# Patient Record
Sex: Female | Born: 1945
Health system: Southern US, Community
[De-identification: ages and names within clinical notes are randomized; demographics above are authoritative.]

## PROBLEM LIST (undated history)

## (undated) DIAGNOSIS — I10 Essential (primary) hypertension: Secondary | ICD-10-CM

## (undated) DIAGNOSIS — E7800 Pure hypercholesterolemia, unspecified: Secondary | ICD-10-CM

## (undated) HISTORY — DX: Pure hypercholesterolemia, unspecified: E78.00

---

## 2006-01-06 ENCOUNTER — Encounter: Admission: RE | Admit: 2006-01-06 | Discharge: 2006-01-06 | Payer: Self-pay | Admitting: Cardiology

## 2006-01-13 ENCOUNTER — Other Ambulatory Visit: Admission: RE | Admit: 2006-01-13 | Discharge: 2006-01-13 | Payer: Self-pay | Admitting: Gynecology

## 2006-02-01 ENCOUNTER — Encounter: Admission: RE | Admit: 2006-02-01 | Discharge: 2006-02-01 | Payer: Self-pay | Admitting: Gynecology

## 2007-01-19 ENCOUNTER — Other Ambulatory Visit: Admission: RE | Admit: 2007-01-19 | Discharge: 2007-01-19 | Payer: Self-pay | Admitting: Gynecology

## 2007-02-04 ENCOUNTER — Encounter: Admission: RE | Admit: 2007-02-04 | Discharge: 2007-02-04 | Payer: Self-pay | Admitting: Gynecology

## 2008-02-06 ENCOUNTER — Encounter: Admission: RE | Admit: 2008-02-06 | Discharge: 2008-02-06 | Payer: Self-pay | Admitting: Gynecology

## 2009-02-11 ENCOUNTER — Encounter: Admission: RE | Admit: 2009-02-11 | Discharge: 2009-02-11 | Payer: Self-pay | Admitting: Gynecology

## 2010-02-25 ENCOUNTER — Encounter: Admission: RE | Admit: 2010-02-25 | Discharge: 2010-02-25 | Payer: Self-pay | Admitting: Gynecology

## 2011-01-20 ENCOUNTER — Other Ambulatory Visit: Payer: Self-pay | Admitting: Gynecology

## 2011-01-20 DIAGNOSIS — Z1231 Encounter for screening mammogram for malignant neoplasm of breast: Secondary | ICD-10-CM

## 2011-02-27 ENCOUNTER — Ambulatory Visit
Admission: RE | Admit: 2011-02-27 | Discharge: 2011-02-27 | Disposition: A | Discharge status: Home / Self Care | Payer: Medicare Other | Source: Ambulatory Visit | Attending: Gynecology | Admitting: Gynecology

## 2011-02-27 DIAGNOSIS — Z1231 Encounter for screening mammogram for malignant neoplasm of breast: Secondary | ICD-10-CM

## 2011-03-06 ENCOUNTER — Other Ambulatory Visit: Payer: Self-pay | Admitting: Gynecology

## 2011-03-06 DIAGNOSIS — R928 Other abnormal and inconclusive findings on diagnostic imaging of breast: Secondary | ICD-10-CM

## 2011-03-17 ENCOUNTER — Ambulatory Visit
Admission: RE | Admit: 2011-03-17 | Discharge: 2011-03-17 | Disposition: A | Discharge status: Home / Self Care | Payer: Medicare Other | Source: Ambulatory Visit | Attending: Gynecology | Admitting: Gynecology

## 2011-03-17 DIAGNOSIS — R928 Other abnormal and inconclusive findings on diagnostic imaging of breast: Secondary | ICD-10-CM

## 2012-02-12 ENCOUNTER — Other Ambulatory Visit: Payer: Self-pay | Admitting: Gynecology

## 2012-02-12 DIAGNOSIS — Z1231 Encounter for screening mammogram for malignant neoplasm of breast: Secondary | ICD-10-CM

## 2012-03-15 ENCOUNTER — Ambulatory Visit
Admission: RE | Admit: 2012-03-15 | Discharge: 2012-03-15 | Disposition: A | Discharge status: Home / Self Care | Payer: Medicare Other | Source: Ambulatory Visit | Attending: Gynecology | Admitting: Gynecology

## 2012-03-15 DIAGNOSIS — Z1231 Encounter for screening mammogram for malignant neoplasm of breast: Secondary | ICD-10-CM

## 2012-09-21 ENCOUNTER — Emergency Department (INDEPENDENT_AMBULATORY_CARE_PROVIDER_SITE_OTHER): Payer: Medicare PPO

## 2012-09-21 ENCOUNTER — Emergency Department (HOSPITAL_COMMUNITY)
Admission: EM | Admit: 2012-09-21 | Discharge: 2012-09-21 | Disposition: A | Discharge status: Home / Self Care | Payer: Medicare PPO | Source: Home / Self Care

## 2012-09-21 ENCOUNTER — Encounter (HOSPITAL_COMMUNITY): Payer: Self-pay | Admitting: Emergency Medicine

## 2012-09-21 DIAGNOSIS — S63599A Other specified sprain of unspecified wrist, initial encounter: Secondary | ICD-10-CM

## 2012-09-21 DIAGNOSIS — S66819A Strain of other specified muscles, fascia and tendons at wrist and hand level, unspecified hand, initial encounter: Secondary | ICD-10-CM

## 2012-09-21 HISTORY — DX: Essential (primary) hypertension: I10

## 2012-09-21 NOTE — ED Provider Notes (Signed)
ANDHistory     CSN: [id number]  Arrival date & time 09/21/12  1428   None     Chief Complaint  Patient presents with  . Hand Injury    (Consider location/radiation/quality/duration/timing/severity/associated sxs/prior treatment) HPI Comments: 67 year old female fell ascending the stairs approximately 3 days ago. She fell onto her right outstretched hand injuring her right wrist. She is complaining of painful mobility and swelling. She states it has improved somewhat however her symptoms are persistent. Denies injury to the hand elbow or shoulder. Denies injury to the head neck chest back or other extremities.   Past Medical History  Diagnosis Date  . Hypertension     History reviewed. No pertinent past surgical history.  No family history on file.  History  Substance Use Topics  . Smoking status: Never Smoker   . Smokeless tobacco: Not on file  . Alcohol Use: No    OB History   Grav Para Term Preterm Abortions TAB SAB Ect Mult Living                  Review of Systems  Constitutional: Negative for fever, chills and activity change.  HENT: Negative.   Respiratory: Negative.   Cardiovascular: Negative.   Musculoskeletal:       As per HPI  Skin: Negative for color change, pallor and rash.  Neurological: Negative.     Allergies  Review of patient's allergies indicates no known allergies.  Home Medications   Current Outpatient Rx  Name  Route  Sig  Dispense  Refill  . amLODipine-valsartan (EXFORGE) 5-320 MG per tablet   Oral   Take 1 tablet by mouth daily.         Marland Kitchen aspirin 81 MG tablet   Oral   Take 81 mg by mouth daily.         . rosuvastatin (CRESTOR) 10 MG tablet   Oral   Take 10 mg by mouth daily.           BP 139/67  Pulse 70  Temp(Src) 98.1 F (36.7 C) (Oral)  Resp 24  SpO2 99%  Physical Exam  Constitutional: She is oriented to person, place, and time. She appears well-developed and well-nourished. No distress.  HENT:  Head:  Normocephalic and atraumatic.  Eyes: EOM are normal. Pupils are equal, round, and reactive to light.  Neck: Normal range of motion. Neck supple.  Musculoskeletal: Normal range of motion. She exhibits edema.  Minor tenderness to the right wrist. Minor swelling. No specific bony tenderness. Strength is 3/5. Distal neurovascular is intact. No deformity or discoloration.  Lymphadenopathy:    She has no cervical adenopathy.  Neurological: She is alert and oriented to person, place, and time. No cranial nerve deficit.  Skin: Skin is warm and dry.  Psychiatric: She has a normal mood and affect.    ED Course  Procedures (including critical care time)  Labs Reviewed - No data to display Dg Wrist Complete Right  09/21/2012  *RADIOLOGY REPORT*  Clinical Data: Fall, persistent pain  RIGHT WRIST - COMPLETE 3+ VIEW  Comparison: None.  Findings: Normal alignment.  On the lateral view, there is minimal dorsal cortical irregularity of the distal radius.  This could represent a subtle nondisplaced fracture.  Distal ulna and carpal bones appear intact.  Recommend correlation for point tenderness in this region.  IMPRESSION: Right distal radius minimal dorsal cortical irregularity, nondisplaced fracture not excluded.   Original Report Authenticated By: Judie Petit. Miles Costain, M.D.  1. Wrist sprain and strain, right, initial encounter       MDM  tHERE Is a question of will irregularity the dorsal aspect of the distal radius. Clinical correlation refills minor tenderness to the dorsum of the wrist but no specific point tenderness in the area of the cortical irregularity. Have advised the patient to apply ice for the next couple days off and on, wear the wrist splint at all times for at least one week. If there is no improvement in 3-4 days or any new symptoms or problems or worsening call the orthopedist on her instruction sheet for an appointment. Limit use of the left hand and wrist.         Hayden Rasmussen,  NP 09/21/12 1713

## 2012-09-21 NOTE — ED Notes (Signed)
Pt c/o right hand inj onset Saturday Reports falling down the steps going into the house (onto concrete) Sx include: swelling of hand/arm and pain that increases w/activity Denies: Head inj/LOC She is able to move her hand w/mild discomfort Has placed icepack over it  She is alert and oriented w/no signs of acute distress.

## 2012-09-21 NOTE — ED Provider Notes (Signed)
Medical screening examination/treatment/procedure(s) were performed by non-physician practitioner and as supervising physician I was immediately available for consultation/collaboration.  Leslee Home, M.D.  Reuben Likes, MD 09/21/12 [phone]

## 2013-02-15 ENCOUNTER — Other Ambulatory Visit: Payer: Self-pay

## 2013-09-26 ENCOUNTER — Encounter: Payer: Self-pay | Admitting: Gynecology

## 2013-09-26 ENCOUNTER — Ambulatory Visit (INDEPENDENT_AMBULATORY_CARE_PROVIDER_SITE_OTHER): Payer: Medicare PPO | Admitting: Gynecology

## 2013-09-26 VITALS — BP 138/88 | Ht 72.0 in | Wt 149.0 lb

## 2013-09-26 DIAGNOSIS — IMO0002 Reserved for concepts with insufficient information to code with codable children: Secondary | ICD-10-CM

## 2013-09-26 DIAGNOSIS — I10 Essential (primary) hypertension: Secondary | ICD-10-CM | POA: Insufficient documentation

## 2013-09-26 DIAGNOSIS — N952 Postmenopausal atrophic vaginitis: Secondary | ICD-10-CM

## 2013-09-26 DIAGNOSIS — M8580 Other specified disorders of bone density and structure, unspecified site: Secondary | ICD-10-CM | POA: Insufficient documentation

## 2013-09-26 DIAGNOSIS — E785 Hyperlipidemia, unspecified: Secondary | ICD-10-CM | POA: Insufficient documentation

## 2013-09-26 DIAGNOSIS — N951 Menopausal and female climacteric states: Secondary | ICD-10-CM

## 2013-09-26 DIAGNOSIS — M949 Disorder of cartilage, unspecified: Secondary | ICD-10-CM

## 2013-09-26 DIAGNOSIS — M899 Disorder of bone, unspecified: Secondary | ICD-10-CM

## 2013-09-26 DIAGNOSIS — Z780 Asymptomatic menopausal state: Secondary | ICD-10-CM

## 2013-09-26 MED ORDER — ESTRADIOL 0.1 MG/GM VA CREA: 1.0000 | TOPICAL_CREAM | VAGINAL | Status: DC

## 2013-09-26 MED ORDER — METRONIDAZOLE 0.75 % VA GEL: VAGINAL | Status: DC

## 2013-09-26 NOTE — Patient Instructions (Addendum)
Bone Densitometry Bone densitometry is a special X-ray that measures your bone density and can be used to help predict your risk of bone fractures. This test is used to determine bone mineral content and density to diagnose osteoporosis. Osteoporosis is the loss of bone that may cause the bone to become weak. Osteoporosis commonly occurs in women entering menopause. However, it may be found in men and in people with other diseases. PREPARATION FOR TEST No preparation necessary. WHO SHOULD BE TESTED?  All women older than 65.  Postmenopausal women (50 to 65) with risk factors for osteoporosis.  People with a previous fracture caused by normal activities.  People with a small body frame (less than 127 poundsor a body mass index [BMI] of less than 21).  People who have a parent with a hip fracture or history of osteoporosis.  People who smoke.  People who have rheumatoid arthritis.  Anyone who engages in excessive alcohol use (more than 3 drinks most days).  Women who experience early menopause. WHEN SHOULD YOU BE RETESTED? Current guidelines suggest that you should wait at least 2 years before doing a bone density test again if your first test was normal.Recent studies indicated that women with normal bone density may be able to wait a few years before needing to repeat a bone density test. You should discuss this with your caregiver.  NORMAL FINDINGS   Normal: less than standard deviation below normal (greater than -1).  Osteopenia: 1 to 2.5 standard deviations below normal (-1 to -2.5).  Osteoporosis: greater than 2.5 standard deviations below normal (less than -2.5). Test results are reported as a "T score" and a "Z score."The T score is a number that compares your bone density with the bone density of healthy, young women.The Z score is a number that compares your bone density with the scores of women who are the same age, gender, and race.  Ranges for normal findings may vary  among different laboratories and hospitals. You should always check with your doctor after having lab work or other tests done to discuss the meaning of your test results and whether your values are considered within normal limits. MEANING OF TEST  Your caregiver will go over the test results with you and discuss the importance and meaning of your results, as well as treatment options and the need for additional tests if necessary. OBTAINING THE TEST RESULTS It is your responsibility to obtain your test results. Ask the lab or department performing the test when and how you will get your results. Document Released: 06/23/2004 Document Revised: 08/24/2011 Document Reviewed: 07/16/2010 Exit[employer] Patient Information 2014 [employer]. Shingles Vaccine What You Need to Know WHAT IS SHINGLES?  Shingles is a painful skin rash, often with blisters. It is also called Herpes Zoster or just Zoster.  A shingles rash usually appears on one side of the face or body and lasts from 2 to 4 weeks. Its main symptom is pain, which can be quite severe. Other symptoms of shingles can include fever, headache, chills, and upset stomach. Very rarely, a shingles infection can lead to pneumonia, hearing problems, blindness, brain inflammation (encephalitis), or death.  For about 1 person in 5, severe pain can continue even after the rash clears up. This is called post-herpetic neuralgia.  Shingles is caused by the Varicella Zoster virus. This is the same virus that causes chickenpox. Only someone who has had a case of chickenpox or rarely, has gotten chickenpox vaccine, can get shingles. The virus stays   in your body. It can reappear many years later to cause a case of shingles.  You cannot catch shingles from another person with shingles. However, a person who has never had chickenpox (or chickenpox vaccine) could get chickenpox from someone with shingles. This is not very common.  Shingles is far more common in people  50 and older than in younger people. It is also more common in people whose immune systems are weakened because of a disease such as cancer or drugs such as steroids or chemotherapy.  At least 1 million people get shingles per year in the [place]. SHINGLES VACCINE  A vaccine for shingles was licensed in 2006. In clinical trials, the vaccine reduced the risk of shingles by 50%. It can also reduce the pain in people who still get shingles after being vaccinated.  A single dose of shingles vaccine is recommended for adults 60 years of age and older. SOME PEOPLE SHOULD NOT GET SHINGLES VACCINE OR SHOULD WAIT A person should not get shingles vaccine if he or she:  Has ever had a life-threatening allergic reaction to gelatin, the antibiotic neomycin, or any other component of shingles vaccine. Tell your caregiver if you have any severe allergies.  Has a weakened immune system because of current:  AIDS or another disease that affects the immune system.  Treatment with drugs that affect the immune system, such as prolonged use of high-dose steroids.  Cancer treatment, such as radiation or chemotherapy.  Cancer affecting the bone marrow or lymphatic system, such as leukemia or lymphoma.  Is pregnant, or might be pregnant. Women should not become pregnant until at least 4 weeks after getting shingles vaccine. Someone with a minor illness, such as a cold, may be vaccinated. Anyone with a moderate or severe acute illness should usually wait until he or she recovers before getting the vaccine. This includes anyone with a temperature of 101.3 F (38 C) or higher. WHAT ARE THE RISKS FROM SHINGLES VACCINE?  A vaccine, like any medicine, could possibly cause serious problems, such as severe allergic reactions. However, the risk of a vaccine causing serious harm, or death, is extremely small.  No serious problems have been identified with shingles vaccine. Mild Problems  Redness, soreness,  swelling, or itching at the site of the injection (about 1 person in 3).  Headache (about 1 person in 70). Like all vaccines, shingles vaccine is being closely monitored for unusual or severe problems. WHAT IF THERE IS A MODERATE OR SEVERE REACTION? What should I look for? Any unusual condition, such as a severe allergic reaction or a high fever. If a severe allergic reaction occurred, it would be within a few minutes to an hour after the shot. Signs of a serious allergic reaction can include difficulty breathing, weakness, hoarseness or wheezing, a fast heartbeat, hives, dizziness, paleness, or swelling of the throat. What should I do?  Call your caregiver, or get the person to a caregiver right away.  Tell the caregiver what happened, the date and time it happened, and when the vaccination was given.  Ask the caregiver to report the reaction by filing a Vaccine Adverse Event Reporting System (VAERS) form. Or, you can file this report through the VAERS web site at www.vaers.hhs.gov or by calling 1-800-822-7967. VAERS does not provide medical advice. HOW CAN I LEARN MORE?  Ask your caregiver. He or she can give you the vaccine package insert or suggest other sources of information.  Contact the Centers for Disease Control and   Prevention (CDC):  Call 1-800-232-4636 (1-800-CDC-INFO).  Visit the CDC website at www.cdc.gov/vaccines CDC Shingles Vaccine VIS (03/20/08) Document Released: 03/29/2006 Document Revised: 08/24/2011 Document Reviewed: 09/21/2012 [employer] Patient Information 2014 [employer].  

## 2013-09-26 NOTE — Progress Notes (Signed)
Regina Gilbert 14-Jan-1946 [id number]   68 y.o.  new patient to the practice who is a formal patient of Dr. Nicholas LoseLomax. Patient did bring some records with her. Her PCP is Dr. Glenis SmokerScott Holwarda will be doing her blood work and is treating her for dyslipidemia as well as hypertension.  Patient also with history of diverticulosis in the past. She's had a traumatic collar fracture and has had surgery for a bunion on her foot. The patient has been using Estrace vaginal cream 2-3 times a week for vaginal atrophy. Patient denies any past history of abnormal Pap smears. She has not had her shingles vaccines. Her colonoscopy was reported by her to be normal in 2012. Her last mammogram was in 2013.  Bone density 2013 had demonstrated lowest T score at the AP spine with a value of -2.0.  History:    68 y.o.    Past medical history,surgical history, family history and social history were all reviewed and documented in the EPIC chart.  Gynecologic History No LMP recorded. Patient is postmenopausal. Contraception: post menopausal status Last Pap: 2013. Results were: normal Last mammogram: 2013. Results were: Normal but dense  Obstetric History OB History  Gravida Para Term Preterm AB SAB TAB Ectopic Multiple Living  0                  ROS: A ROS was performed and pertinent positives and negatives are included in the history.  GENERAL: No fevers or chills. HEENT: No change in vision, no earache, sore throat or sinus congestion. NECK: No pain or stiffness. CARDIOVASCULAR: No chest pain or pressure. No palpitations. PULMONARY: No shortness of breath, cough or wheeze. GASTROINTESTINAL: No abdominal pain, nausea, vomiting or diarrhea, melena or bright red blood per rectum. GENITOURINARY: No urinary frequency, urgency, hesitancy or dysuria. MUSCULOSKELETAL: No joint or muscle pain, no back pain, no recent trauma. DERMATOLOGIC: No rash, no itching, no lesions. ENDOCRINE: No polyuria, polydipsia, no heat or cold  intolerance. No recent change in weight. HEMATOLOGICAL: No anemia or easy bruising or bleeding. NEUROLOGIC: No headache, seizures, numbness, tingling or weakness. PSYCHIATRIC: No depression, no loss of interest in normal activity or change in sleep pattern.     Exam: chaperone present  BP 138/88  Ht 6' (1.829 m)  Wt 149 lb (67.586 kg)  BMI 20.20 kg/m2  Body mass index is 20.2 kg/(m^2).  General appearance : Well developed well nourished female. No acute distress HEENT: Neck supple, trachea midline, no carotid bruits, no thyroidmegaly Lungs: Clear to auscultation, no rhonchi or wheezes, or rib retractions  Heart: Regular rate and rhythm, no murmurs or gallops Breast:Examined in sitting and supine position were symmetrical in appearance, no palpable masses or tenderness,  no skin retraction, no nipple inversion, no nipple discharge, no skin discoloration, no axillary or supraclavicular lymphadenopathy Abdomen: no palpable masses or tenderness, no rebound or guarding Extremities: no edema or skin discoloration or tenderness  Pelvic:  Bartholin, Urethra, Skene Glands: Within normal limits             Vagina: No gross lesions or discharge, atrophic changes  Cervix: No gross lesions or discharge  Uterus  axial, normal size, shape and consistency, non-tender and mobile  Adnexa  Without masses or tenderness  Anus and perineum  normal   Rectovaginal  normal sphincter tone without palpated masses or tenderness             Hemoccult PCP provides     Assessment/Plan:  68  y.o. female with vaginal atrophy responding well to Estrace vaginal cream 2-3 times per week. Patient with history of osteopenia needs a followup bone density study September of this year. A CT will be drawn her blood work. Pap smear not done today in accordance to the new guidelines. Patient was provided with a requisition to schedule her shingles vaccines administration of her local pharmacy. Her prescription refill for  Estrace vaginal cream was provided. We discussed the importance of calcium and vitamin D in regular exercise for osteoporosis. Her PCP will be drawn her blood work.  Note: This dictation was prepared with  Dragon/digital dictation along withSmart phrase technology. Any transcriptional errors that result from this process are unintentional.   Ok EdwardsJuan H Fernandez MD, 1:45 PM 09/26/2013

## 2013-10-02 ENCOUNTER — Other Ambulatory Visit: Payer: Self-pay

## 2013-10-02 DIAGNOSIS — Z1231 Encounter for screening mammogram for malignant neoplasm of breast: Secondary | ICD-10-CM

## 2013-10-11 ENCOUNTER — Encounter: Payer: Self-pay | Admitting: Gynecology

## 2013-10-20 ENCOUNTER — Ambulatory Visit
Admission: RE | Admit: 2013-10-20 | Discharge: 2013-10-20 | Disposition: A | Discharge status: Home / Self Care | Payer: Medicare PPO | Source: Ambulatory Visit

## 2013-10-20 DIAGNOSIS — Z1231 Encounter for screening mammogram for malignant neoplasm of breast: Secondary | ICD-10-CM

## 2014-09-17 ENCOUNTER — Other Ambulatory Visit: Payer: Self-pay

## 2014-09-17 DIAGNOSIS — Z1231 Encounter for screening mammogram for malignant neoplasm of breast: Secondary | ICD-10-CM

## 2014-10-01 ENCOUNTER — Encounter: Payer: Self-pay | Admitting: Gynecology

## 2014-10-01 ENCOUNTER — Ambulatory Visit (INDEPENDENT_AMBULATORY_CARE_PROVIDER_SITE_OTHER): Payer: Medicare PPO | Admitting: Gynecology

## 2014-10-01 VITALS — BP 124/78 | Ht 61.5 in | Wt 159.0 lb

## 2014-10-01 DIAGNOSIS — N3281 Overactive bladder: Secondary | ICD-10-CM | POA: Diagnosis not present

## 2014-10-01 DIAGNOSIS — Z79890 Hormone replacement therapy (postmenopausal): Secondary | ICD-10-CM | POA: Diagnosis not present

## 2014-10-01 DIAGNOSIS — M8580 Other specified disorders of bone density and structure, unspecified site: Secondary | ICD-10-CM

## 2014-10-01 DIAGNOSIS — Z01419 Encounter for gynecological examination (general) (routine) without abnormal findings: Secondary | ICD-10-CM | POA: Diagnosis not present

## 2014-10-01 NOTE — Progress Notes (Signed)
Regina Gilbert 1946/03/21 [id number]   History:    69 y.o.  for annual gyn exam with the only complaint that at times she feels like she needs to go to the bathroom more frequently and does not empty completely but does not have true nocturia. Patient denies any urinary incontinence.Her PCP is Dr. Glenis SmokerScott Holwarda will be doing her blood work and is treating her for dyslipidemia as well as hypertension.  Patient also with history of diverticulosis in the past. She's had a traumatic collar fracture and has had surgery for a bunion on her foot. The patient has been using Estrace vaginal cream 2-3 times a week for vaginal atrophy. Patient denies any past history of abnormal Pap smears. She has not had her shingles vaccines. Her colonoscopy was reported by her to be normal in 2012. Patient stated her PCP had  ordered her bone density study in 2015 and she was diagnosed with osteopenia we do not have records.patient did state that she received her shingles vaccine last year.patient reports no past history of any abnormal Pap smears  Past medical history,surgical history, family history and social history were all reviewed and documented in the EPIC chart.  Gynecologic History No LMP recorded. Patient is postmenopausal. Contraception: post menopausal status Last Pap: several years ago. Results were: normal Last mammogram: 2015. Results were: normal  Obstetric History OB History  Gravida Para Term Preterm AB SAB TAB Ectopic Multiple Living  0                  ROS: A ROS was performed and pertinent positives and negatives are included in the history.  GENERAL: No fevers or chills. HEENT: No change in vision, no earache, sore throat or sinus congestion. NECK: No pain or stiffness. CARDIOVASCULAR: No chest pain or pressure. No palpitations. PULMONARY: No shortness of breath, cough or wheeze. GASTROINTESTINAL: No abdominal pain, nausea, vomiting or diarrhea, melena or bright red blood per rectum.  GENITOURINARY: No urinary frequency, urgency, hesitancy or dysuria. MUSCULOSKELETAL: No joint or muscle pain, no back pain, no recent trauma. DERMATOLOGIC: No rash, no itching, no lesions. ENDOCRINE: No polyuria, polydipsia, no heat or cold intolerance. No recent change in weight. HEMATOLOGICAL: No anemia or easy bruising or bleeding. NEUROLOGIC: No headache, seizures, numbness, tingling or weakness. PSYCHIATRIC: No depression, no loss of interest in normal activity or change in sleep pattern.     Exam: chaperone present  BP 124/78 mmHg  Ht 5' 1.5" (1.562 m)  Wt 159 lb (72.122 kg)  BMI 29.56 kg/m2  Body mass index is 29.56 kg/(m^2).  General appearance : Well developed well nourished female. No acute distress HEENT: Eyes: no retinal hemorrhage or exudates,  Neck supple, trachea midline, no carotid bruits, no thyroidmegaly Lungs: Clear to auscultation, no rhonchi or wheezes, or rib retractions  Heart: Regular rate and rhythm, no murmurs or gallops Breast:Examined in sitting and supine position were symmetrical in appearance, no palpable masses or tenderness,  no skin retraction, no nipple inversion, no nipple discharge, no skin discoloration, no axillary or supraclavicular lymphadenopathy Abdomen: no palpable masses or tenderness, no rebound or guarding Extremities: no edema or skin discoloration or tenderness  Pelvic:  Bartholin, Urethra, Skene Glands: Within normal limits             Vagina: No gross lesions or discharge, atrophic changes Estrace cream noted in vagina  Cervix: No gross lesions or discharge  Uterus  axial, normal size, shape and consistency, non-tender and mobile  Adnexa  Without masses or tenderness  Anus and perineum  normal   Rectovaginal  normal sphincter tone without palpated masses or tenderness             Hemoccult PCP provides     Assessment/Plan:  69 y.o. female for annual exam who was counseled on cutting down her Estrace vaginal cream from 3 times a week  to 2 times a week. She reports no vaginal bleeding. Her PCP has been doing her blood work. She scheduled for mammogram in May of this year. I've asked her to request a copy of her bone density study from her PCP so that we can update our records. We discussed importance of calcium vitamin D and regular exercise for osteoporosis prevention. Pap smear not done today in accordance to the new guidelines.information on overactive bladder no KO exercises provided. Patient wishes to monitor symptoms if worsen she will return to seek medical treatment.   Ok Edwards MD, 12:01 PM 10/01/2014

## 2014-10-01 NOTE — Patient Instructions (Addendum)
Kegel Exercises The goal of Kegel exercises is to isolate and exercise your pelvic floor muscles. These muscles act as a hammock that supports the rectum, vagina, small intestine, and uterus. As the muscles weaken, the hammock sags and these organs are displaced from their normal positions. Kegel exercises can strengthen your pelvic floor muscles and help you to improve bladder and bowel control, improve sexual response, and help reduce many problems and some discomfort during pregnancy. Kegel exercises can be done anywhere and at any time. HOW TO PERFORM KEGEL EXERCISES 1. Locate your pelvic floor muscles. To do this, squeeze (contract) the muscles that you use when you try to stop the flow of urine. You will feel a tightness in the vaginal area (women) and a tight lift in the rectal area (men and women). 2. When you begin, contract your pelvic muscles tight for 2-5 seconds, then relax them for 2-5 seconds. This is one set. Do 4-5 sets with a short pause in between. 3. Contract your pelvic muscles for 8-10 seconds, then relax them for 8-10 seconds. Do 4-5 sets. If you cannot contract your pelvic muscles for 8-10 seconds, try 5-7 seconds and work your way up to 8-10 seconds. Your goal is 4-5 sets of 10 contractions each day. Keep your stomach, buttocks, and legs relaxed during the exercises. Perform sets of both short and long contractions. Vary your positions. Perform these contractions 3-4 times per day. Perform sets while you are:   Lying in bed in the morning.  Standing at lunch.  Sitting in the late afternoon.  Lying in bed at night. You should do 40-50 contractions per day. Do not perform more Kegel exercises per day than recommended. Overexercising can cause muscle fatigue. Continue these exercises for for at least 15-20 weeks or as directed by your caregiver. Document Released: 05/18/2012 Document Reviewed: 05/18/2012 [employer] Patient Information 2015 [employer]. This information is  not intended to replace advice given to you by your health care provider. Make sure you discuss any questions you have with your health care provider.  

## 2014-10-24 ENCOUNTER — Ambulatory Visit: Payer: Medicare PPO

## 2014-10-29 ENCOUNTER — Ambulatory Visit
Admission: RE | Admit: 2014-10-29 | Discharge: 2014-10-29 | Disposition: A | Discharge status: Home / Self Care | Payer: Medicare PPO | Source: Ambulatory Visit

## 2014-10-29 DIAGNOSIS — Z1231 Encounter for screening mammogram for malignant neoplasm of breast: Secondary | ICD-10-CM

## 2015-04-27 ENCOUNTER — Other Ambulatory Visit: Payer: Self-pay | Admitting: Gynecology

## 2015-05-03 DIAGNOSIS — H25813 Combined forms of age-related cataract, bilateral: Secondary | ICD-10-CM | POA: Diagnosis not present

## 2015-05-03 DIAGNOSIS — H04123 Dry eye syndrome of bilateral lacrimal glands: Secondary | ICD-10-CM | POA: Diagnosis not present

## 2015-05-03 DIAGNOSIS — H43813 Vitreous degeneration, bilateral: Secondary | ICD-10-CM | POA: Diagnosis not present

## 2015-05-03 DIAGNOSIS — H53002 Unspecified amblyopia, left eye: Secondary | ICD-10-CM | POA: Diagnosis not present

## 2015-09-24 ENCOUNTER — Other Ambulatory Visit: Payer: Self-pay

## 2015-09-24 DIAGNOSIS — Z1231 Encounter for screening mammogram for malignant neoplasm of breast: Secondary | ICD-10-CM

## 2015-10-03 ENCOUNTER — Encounter: Payer: Medicare PPO | Admitting: Gynecology

## 2015-10-03 ENCOUNTER — Encounter: Payer: Self-pay | Admitting: Gynecology

## 2015-10-03 ENCOUNTER — Ambulatory Visit (INDEPENDENT_AMBULATORY_CARE_PROVIDER_SITE_OTHER): Payer: Medicare Other | Admitting: Gynecology

## 2015-10-03 VITALS — BP 130/70 | Ht 61.5 in | Wt 147.0 lb

## 2015-10-03 DIAGNOSIS — Z01419 Encounter for gynecological examination (general) (routine) without abnormal findings: Secondary | ICD-10-CM

## 2015-10-03 DIAGNOSIS — M899 Disorder of bone, unspecified: Secondary | ICD-10-CM

## 2015-10-03 DIAGNOSIS — Z79890 Hormone replacement therapy (postmenopausal): Secondary | ICD-10-CM

## 2015-10-03 MED ORDER — ESTRADIOL 0.1 MG/GM VA CREA: 1.0000 | TOPICAL_CREAM | VAGINAL | Status: DC

## 2015-10-03 NOTE — Progress Notes (Signed)
Regina Gilbert 03/23/1946 [id number]   History:    70 y.o.  for annual gyn exam with no complaints today. She is using Estrace vaginal cream twice a week for vaginal atrophy and has done well. Patient reports no vaginal bleeding.Her PCP is Dr. Glenis SmokerScott Holwarda will be doing her blood work and is treating her for dyslipidemia as well as hypertension.Patient denies any past history of abnormal Pap smears. She has not had her shingles vaccines. Her colonoscopy was reported by her to be normal in 2012. Patient stated her PCP had ordered her bone density study in 2015 and she was diagnosed with osteopenia we do not have records.patient did state that she received her shingles vaccine last year.  Past medical history,surgical history, family history and social history were all reviewed and documented in the EPIC chart.  Gynecologic History No LMP recorded. Patient is postmenopausal. Contraception: post menopausal status Last Pap: 2013. Results were: normal Last mammogram: 2016. Results were: Had three-dimensional mammogram due to dense breasts normal  Obstetric History OB History  Gravida Para Term Preterm AB SAB TAB Ectopic Multiple Living  0                  ROS: A ROS was performed and pertinent positives and negatives are included in the history.  GENERAL: No fevers or chills. HEENT: No change in vision, no earache, sore throat or sinus congestion. NECK: No pain or stiffness. CARDIOVASCULAR: No chest pain or pressure. No palpitations. PULMONARY: No shortness of breath, cough or wheeze. GASTROINTESTINAL: No abdominal pain, nausea, vomiting or diarrhea, melena or bright red blood per rectum. GENITOURINARY: No urinary frequency, urgency, hesitancy or dysuria. MUSCULOSKELETAL: No joint or muscle pain, no back pain, no recent trauma. DERMATOLOGIC: No rash, no itching, no lesions. ENDOCRINE: No polyuria, polydipsia, no heat or cold intolerance. No recent change in weight. HEMATOLOGICAL: No anemia  or easy bruising or bleeding. NEUROLOGIC: No headache, seizures, numbness, tingling or weakness. PSYCHIATRIC: No depression, no loss of interest in normal activity or change in sleep pattern.     Exam: chaperone present  BP 130/70 mmHg  Ht 5' 1.5" (1.562 m)  Wt 147 lb (66.679 kg)  BMI 27.33 kg/m2  Body mass index is 27.33 kg/(m^2).  General appearance : Well developed well nourished female. No acute distress HEENT: Eyes: no retinal hemorrhage or exudates,  Neck supple, trachea midline, no carotid bruits, no thyroidmegaly Lungs: Clear to auscultation, no rhonchi or wheezes, or rib retractions  Heart: Regular rate and rhythm, no murmurs or gallops Breast:Examined in sitting and supine position were symmetrical in appearance, no palpable masses or tenderness,  no skin retraction, no nipple inversion, no nipple discharge, no skin discoloration, no axillary or supraclavicular lymphadenopathy Abdomen: no palpable masses or tenderness, no rebound or guarding Extremities: no edema or skin discoloration or tenderness  Pelvic:  Bartholin, Urethra, Skene Glands: Within normal limits             Vagina: No gross lesions or discharge, atrophic changes  Cervix: No gross lesions or discharge  Uterus  anteverted, normal size, shape and consistency, non-tender and mobile  Adnexa  Without masses or tenderness  Anus and perineum  normal   Rectovaginal  normal sphincter tone without palpated masses or tenderness             Hemoccult PCP provides     Assessment/Plan:  70 y.o. female for annual exam doing well on vaginal estrogen twice a week prescription refill provided.  Pap smear no longer indicated according to the new guidelines. Patient with no past history of any abnormal Pap smears. Patient is scheduled 3-D mammogram next month. She is due for bone density study at the end of October which her PCP we'll be ordering. She was instructed to take her calcium and vitamin D along with weightbearing  exercises on a regular basis. She was reminded do her monthly breast exams.   Ok Edwards MD, 10:56 AM 10/03/2015

## 2015-10-30 ENCOUNTER — Ambulatory Visit
Admission: RE | Admit: 2015-10-30 | Discharge: 2015-10-30 | Disposition: A | Discharge status: Home / Self Care | Payer: Medicare Other | Source: Ambulatory Visit

## 2015-10-30 DIAGNOSIS — Z1231 Encounter for screening mammogram for malignant neoplasm of breast: Secondary | ICD-10-CM

## 2015-11-14 ENCOUNTER — Ambulatory Visit (INDEPENDENT_AMBULATORY_CARE_PROVIDER_SITE_OTHER): Payer: Medicare Other | Admitting: Podiatry

## 2015-11-14 ENCOUNTER — Encounter: Payer: Self-pay | Admitting: Podiatry

## 2015-11-14 DIAGNOSIS — M216X9 Other acquired deformities of unspecified foot: Secondary | ICD-10-CM

## 2015-11-14 DIAGNOSIS — Q828 Other specified congenital malformations of skin: Secondary | ICD-10-CM | POA: Diagnosis not present

## 2015-11-14 NOTE — Progress Notes (Signed)
   Subjective:    Patient ID: Regina Gilbert, female    DOB: 06-30-1945, 70 y.o.   MRN: [id number]  HPI    Review of Systems  All other systems reviewed and are negative.      Objective:   Physical Exam        Assessment & Plan:

## 2015-11-15 NOTE — Progress Notes (Signed)
Subjective:     Patient ID: Regina Gilbert, female   DOB: 06/20/1945, 70 y.o.   MRN: [id number]  HPI patient presents with pain in the plantar aspect of the left foot with lesion formation that's been there over the years and is gradually gotten worse over the last year   Review of Systems  All other systems reviewed and are negative.      Objective:   Physical Exam  Constitutional: She is oriented to person, place, and time.  Cardiovascular: Intact distal pulses.   Musculoskeletal: Normal range of motion.  Neurological: She is oriented to person, place, and time.  Skin: Skin is warm.  Nursing note and vitals reviewed.  neurovascular status intact muscle strength adequate range of motion within normal limits with patient noted to have keratotic lesion sub-fourth metatarsal left that's painful when pressed with some bone protrusion on the plantar surface. Patient does have a lucent core when checked and was noted to have good digital perfusion and is well oriented 3     Assessment:     Plantarflexed metatarsal with keratotic lesion fourth metatarsal left    Plan:     H&P and condition reviewed and recommended debridement which was accomplished today and I applied padding. Explained that this probably will recur we may need to consider other treatments depending on response

## 2016-08-14 ENCOUNTER — Ambulatory Visit (INDEPENDENT_AMBULATORY_CARE_PROVIDER_SITE_OTHER): Payer: Medicare Other | Admitting: Podiatry

## 2016-08-14 DIAGNOSIS — Q828 Other specified congenital malformations of skin: Secondary | ICD-10-CM | POA: Diagnosis not present

## 2016-08-16 NOTE — Progress Notes (Signed)
Subjective:     Patient ID: Regina Gilbert, female   DOB: [birth year], 71 y.o.   MRN: [id number]  HPI patient presents with painful lesion plantar aspect left foot   Review of Systems     Objective:   Physical Exam Neurovascular status intact with keratotic lesion submetatarsal left    Assessment:     Porokeratotic painful lesion    Plan:     Debride lesion left with no iatrogenic bleeding noted

## 2016-09-18 ENCOUNTER — Other Ambulatory Visit: Payer: Self-pay | Admitting: Gynecology

## 2016-09-18 DIAGNOSIS — Z1231 Encounter for screening mammogram for malignant neoplasm of breast: Secondary | ICD-10-CM

## 2016-10-07 ENCOUNTER — Encounter: Payer: Medicare Other | Admitting: Gynecology

## 2016-10-08 ENCOUNTER — Encounter: Payer: Self-pay | Admitting: Gynecology

## 2016-10-08 ENCOUNTER — Ambulatory Visit (INDEPENDENT_AMBULATORY_CARE_PROVIDER_SITE_OTHER): Payer: Medicare Other | Admitting: Gynecology

## 2016-10-08 VITALS — BP 118/76 | Ht 61.0 in | Wt 149.0 lb

## 2016-10-08 DIAGNOSIS — M8580 Other specified disorders of bone density and structure, unspecified site: Secondary | ICD-10-CM

## 2016-10-08 DIAGNOSIS — Z01419 Encounter for gynecological examination (general) (routine) without abnormal findings: Secondary | ICD-10-CM | POA: Diagnosis not present

## 2016-10-08 MED ORDER — ESTRADIOL 0.1 MG/GM VA CREA: 1.0000 | TOPICAL_CREAM | VAGINAL | 4 refills | Status: DC

## 2016-10-08 NOTE — Progress Notes (Signed)
Regina Gilbert Feb 21, 1946 [id number]   History:    71 y.o.  for annual gyn exam who is asymptomatic today. Patient has done well with vaginal estrogen cream twice a week for vaginal atrophy. Patient reports no vaginal bleeding.Her PCP is Dr. Glenis Smoker will be doing her blood work and is treating her for dyslipidemia as well as hypertension.Patient denies any past history of abnormal Pap smears. She has not had her shingles vaccines. Her colonoscopy was reported by her to be normal in 2012. Patient stated her PCP had ordered her bone density study in 2017 and was diagnosed with osteopenia. She's currently taking her calcium and vitamin D.  Past medical history,surgical history, family history and social history were all reviewed and documented in the EPIC chart.  Gynecologic History No LMP recorded. Patient is postmenopausal. Contraception: post menopausal status Last Pap: 2012. Results were: normal Last mammogram: 2017. Results were: normal  Obstetric History OB History  Gravida Para Term Preterm AB Living  0            SAB TAB Ectopic Multiple Live Births                    ROS: A ROS was performed and pertinent positives and negatives are included in the history.  GENERAL: No fevers or chills. HEENT: No change in vision, no earache, sore throat or sinus congestion. NECK: No pain or stiffness. CARDIOVASCULAR: No chest pain or pressure. No palpitations. PULMONARY: No shortness of breath, cough or wheeze. GASTROINTESTINAL: No abdominal pain, nausea, vomiting or diarrhea, melena or bright red blood per rectum. GENITOURINARY: No urinary frequency, urgency, hesitancy or dysuria. MUSCULOSKELETAL: No joint or muscle pain, no back pain, no recent trauma. DERMATOLOGIC: No rash, no itching, no lesions. ENDOCRINE: No polyuria, polydipsia, no heat or cold intolerance. No recent change in weight. HEMATOLOGICAL: No anemia or easy bruising or bleeding. NEUROLOGIC: No headache, seizures,  numbness, tingling or weakness. PSYCHIATRIC: No depression, no loss of interest in normal activity or change in sleep pattern.     Exam: chaperone present  BP 118/76   Ht  (1.549 m)   Wt 149 lb (67.6 kg)   BMI 28.15 kg/m   Body mass index is 28.15 kg/m.  General appearance : Well developed well nourished female. No acute distress HEENT: Eyes: no retinal hemorrhage or exudates,  Neck supple, trachea midline, no carotid bruits, no thyroidmegaly Lungs: Clear to auscultation, no rhonchi or wheezes, or rib retractions  Heart: Regular rate and rhythm, no murmurs or gallops Breast:Examined in sitting and supine position were symmetrical in appearance, no palpable masses or tenderness,  no skin retraction, no nipple inversion, no nipple discharge, no skin discoloration, no axillary or supraclavicular lymphadenopathy Abdomen: no palpable masses or tenderness, no rebound or guarding Extremities: no edema or skin discoloration or tenderness  Pelvic:  Bartholin, Urethra, Skene Glands: Within normal limits             Vagina: No gross lesions or discharge, atrophic changes  Cervix: No gross lesions or discharge  Uterus  anteverted, normal size, shape and consistency, non-tender and mobile  Adnexa  Without masses or tenderness  Anus and perineum  normal   Rectovaginal  normal sphincter tone without palpated masses or tenderness             Hemoccult PCP provides     Assessment/Plan:  71 y.o. female for annual exam doing well on vaginal estrogen twice a week for vaginal  atrophy reports no vaginal bleeding prescription refill provided. Pap smear no longer indicated according to the new guidelines. PCP is been doing her blood work and her vaccines are up-to-date. Patient with history of osteopenia was encouraged to continue to take her calcium and vitamin D on a regular basis as well as weightbearing exercises 3-4 times a week. She is due for mammogram this year and her bone density study is  been ordered by her PCP is due next year.   Ok Edwards MD, 9:54 AM 10/08/2016

## 2016-10-28 ENCOUNTER — Encounter: Payer: Self-pay | Admitting: Gynecology

## 2016-10-30 ENCOUNTER — Ambulatory Visit
Admission: RE | Admit: 2016-10-30 | Discharge: 2016-10-30 | Disposition: A | Discharge status: Home / Self Care | Payer: Medicare Other | Source: Ambulatory Visit | Attending: Gynecology | Admitting: Gynecology

## 2016-10-30 DIAGNOSIS — Z1231 Encounter for screening mammogram for malignant neoplasm of breast: Secondary | ICD-10-CM

## 2017-01-07 ENCOUNTER — Ambulatory Visit (INDEPENDENT_AMBULATORY_CARE_PROVIDER_SITE_OTHER): Payer: Medicare Other | Admitting: Podiatry

## 2017-01-07 ENCOUNTER — Encounter: Payer: Self-pay | Admitting: Podiatry

## 2017-01-07 DIAGNOSIS — Q828 Other specified congenital malformations of skin: Secondary | ICD-10-CM

## 2017-01-07 NOTE — Progress Notes (Signed)
Subjective:    Patient ID: Regina Gilbert, female   DOB: 71 y.o.   MRN: [id number]   HPI patient states that the lesion is back of my left foot and painful    ROS      Objective:  Physical Exam neurovascular status intact with severe keratotic lesion sub-left second metatarsal that's painful     Assessment:    Lesion secondary to pressure     Plan:    Deep debridement and applied medication with padding and instructed on soft bottom shoes. Reappoint as needed

## 2017-04-15 ENCOUNTER — Ambulatory Visit (INDEPENDENT_AMBULATORY_CARE_PROVIDER_SITE_OTHER): Payer: Medicare Other | Admitting: Podiatry

## 2017-04-15 DIAGNOSIS — Q828 Other specified congenital malformations of skin: Secondary | ICD-10-CM | POA: Diagnosis not present

## 2017-04-15 NOTE — Progress Notes (Signed)
Subjective:    Patient ID: Regina Gilbert, female   DOB: 71 y.o.   MRN: [id number]   HPI patient presents with pain underneath the left foot with lesion formation    ROS      Objective:  Physical Exam neurovascular status intact with keratotic lesion sub-metatarsal left     Assessment:   Chronic lesion with porokeratosis type core left      Plan:    Sterile debridement of lesion left with no iatrogenic bleeding noted

## 2017-09-24 ENCOUNTER — Other Ambulatory Visit: Payer: Self-pay | Admitting: Obstetrics & Gynecology

## 2017-09-24 DIAGNOSIS — Z139 Encounter for screening, unspecified: Secondary | ICD-10-CM

## 2017-10-11 ENCOUNTER — Encounter: Payer: Medicare Other | Admitting: Obstetrics & Gynecology

## 2017-10-14 ENCOUNTER — Ambulatory Visit: Payer: Medicare Other | Admitting: Obstetrics & Gynecology

## 2017-10-14 ENCOUNTER — Encounter: Payer: Self-pay | Admitting: Obstetrics & Gynecology

## 2017-10-14 VITALS — BP 128/70 | Ht 61.0 in | Wt 150.0 lb

## 2017-10-14 DIAGNOSIS — R3915 Urgency of urination: Secondary | ICD-10-CM | POA: Diagnosis not present

## 2017-10-14 DIAGNOSIS — Z780 Asymptomatic menopausal state: Secondary | ICD-10-CM

## 2017-10-14 DIAGNOSIS — Z01419 Encounter for gynecological examination (general) (routine) without abnormal findings: Secondary | ICD-10-CM

## 2017-10-14 MED ORDER — ESTRADIOL 0.1 MG/GM VA CREA: 0.2500 | TOPICAL_CREAM | VAGINAL | 3 refills | Status: DC

## 2017-10-14 NOTE — Progress Notes (Signed)
Regina Gilbert 1945/09/01 [id number]   History:    72 y.o. G0 Married.  RP:  Established patient presenting for annual gyn exam   HPI: Menopause, well on no HRT.  No PMB.  Abstinent now.  No pelvic pain.  Using Estrogen cream twice a week for dryness/bladder.  Occasional urinary urgency.  Rare mild incontinence of urine.  Enjoys tea. BMs wnl.  Breasts wnl.  BMI 28.34.  Will increase walking.  Health labs with Fam MD.  Past medical history,surgical history, family history and social history were all reviewed and documented in the EPIC chart.  Gynecologic History No LMP recorded. Patient is postmenopausal. Contraception: abstinence and post menopausal status Last Pap: 2013. Results were: normal Last mammogram: 10/2016. Results were: Negative Bone Density: 03/2014 Hip Normal Colonoscopy: 2012, 10 yr schedule  Obstetric History OB History  Gravida Para Term Preterm AB Living  0            SAB TAB Ectopic Multiple Live Births                ROS: A ROS was performed and pertinent positives and negatives are included in the history.  GENERAL: No fevers or chills. HEENT: No change in vision, no earache, sore throat or sinus congestion. NECK: No pain or stiffness. CARDIOVASCULAR: No chest pain or pressure. No palpitations. PULMONARY: No shortness of breath, cough or wheeze. GASTROINTESTINAL: No abdominal pain, nausea, vomiting or diarrhea, melena or bright red blood per rectum. GENITOURINARY: No urinary frequency, urgency, hesitancy or dysuria. MUSCULOSKELETAL: No joint or muscle pain, no back pain, no recent trauma. DERMATOLOGIC: No rash, no itching, no lesions. ENDOCRINE: No polyuria, polydipsia, no heat or cold intolerance. No recent change in weight. HEMATOLOGICAL: No anemia or easy bruising or bleeding. NEUROLOGIC: No headache, seizures, numbness, tingling or weakness. PSYCHIATRIC: No depression, no loss of interest in normal activity or change in sleep pattern.     Exam:   BP  128/70   Ht  (1.549 m)   Wt 150 lb (68 kg)   BMI 28.34 kg/m   Body mass index is 28.34 kg/m.  General appearance : Well developed well nourished female. No acute distress HEENT: Eyes: no retinal hemorrhage or exudates,  Neck supple, trachea midline, no carotid bruits, no thyroidmegaly Lungs: Clear to auscultation, no rhonchi or wheezes, or rib retractions  Heart: Regular rate and rhythm, no murmurs or gallops Breast:Examined in sitting and supine position were symmetrical in appearance, no palpable masses or tenderness,  no skin retraction, no nipple inversion, no nipple discharge, no skin discoloration, no axillary or supraclavicular lymphadenopathy Abdomen: no palpable masses or tenderness, no rebound or guarding Extremities: no edema or skin discoloration or tenderness  Pelvic: Vulva: Normal             Vagina: No gross lesions or discharge  Cervix: No gross lesions or discharge.  Pap reflex done  Uterus  Intermediate, normal size, shape and consistency, non-tender and mobile  Adnexa  Without masses or tenderness  Anus: Normal  Assessment/Plan:  72 y.o. female for annual exam   1. Encounter for routine gynecological examination with Papanicolaou smear of cervix Normal gynecologic exam.  Pap reflex done today.  Breast exam normal.  Patient will schedule a screening mammogram May 2019.  Health labs with family physician.  Next colonoscopy in 2022.  2. Menopause present Well on no hormone replacement therapy.  No postmenopausal bleeding.  Vitamin D supplements, calcium rich nutrition and regular weightbearing physical  activity recommended.  Last bone density in 2015 was normal.  Will repeat next year.  3. Urinary urgency Decrease tea and other caffeine containing foods or drinks.  Drink plenty of water.  Will continue on Estrace cream a quarter of an applicator vaginally and a thin application on the vulva and periurethral twice a week.  If no improvement, will refer to  urology.  Other orders - estradiol (ESTRACE VAGINAL) 0.1 MG/GM vaginal cream; Place 0.25 Applicatorfuls vaginally once a week.  Genia Del MD, 11:03 AM 10/14/2017

## 2017-10-17 ENCOUNTER — Encounter: Payer: Self-pay | Admitting: Obstetrics & Gynecology

## 2017-10-17 NOTE — Patient Instructions (Signed)
1. Encounter for routine gynecological examination with Papanicolaou smear of cervix Normal gynecologic exam.  Pap reflex done today.  Breast exam normal.  Patient will schedule a screening mammogram May 2019.  Health labs with family physician.  Next colonoscopy in 2022.  2. Menopause present Well on no hormone replacement therapy.  No postmenopausal bleeding.  Vitamin D supplements, calcium rich nutrition and regular weightbearing physical activity recommended.  Last bone density in 2015 was normal.  Will repeat next year.  3. Urinary urgency Decrease tea and other caffeine containing foods or drinks.  Drink plenty of water.  Will continue on Estrace cream a quarter of an applicator vaginally and a thin application on the vulva and periurethral twice a week.  If no improvement, will refer to urology.  Other orders - estradiol (ESTRACE VAGINAL) 0.1 MG/GM vaginal cream; Place 0.25 Applicatorfuls vaginally once a week.  Layla, it was a pleasure meeting you today!  I will inform you of your results as soon as they are available.

## 2017-10-18 LAB — PAP IG W/ RFLX HPV ASCU

## 2017-11-01 ENCOUNTER — Ambulatory Visit
Admission: RE | Admit: 2017-11-01 | Discharge: 2017-11-01 | Disposition: A | Discharge status: Home / Self Care | Payer: Medicare Other | Source: Ambulatory Visit | Attending: Obstetrics & Gynecology | Admitting: Obstetrics & Gynecology

## 2017-11-01 DIAGNOSIS — Z139 Encounter for screening, unspecified: Secondary | ICD-10-CM

## 2017-11-18 ENCOUNTER — Encounter: Payer: Self-pay | Admitting: Podiatry

## 2017-11-18 ENCOUNTER — Ambulatory Visit: Payer: Medicare Other | Admitting: Podiatry

## 2017-11-18 DIAGNOSIS — Q828 Other specified congenital malformations of skin: Secondary | ICD-10-CM | POA: Diagnosis not present

## 2017-11-21 NOTE — Progress Notes (Signed)
Subjective:   Patient ID: Regina Gilbert, female   DOB: 72 y.o.   MRN: [id number]   HPI Patient presents stating this lesion has come back on the left plantar foot and it is painful to walk on   ROS      Objective:  Physical Exam  Neurovascular status intact with keratotic lesion sub-left third metatarsal with lucent core that is painful     Assessment:  Porokeratotic lesion with thickness and pain with palpation     Plan:  Sharp sterile debridement of lesion accomplished with no iatrogenic bleeding and applied padding with medication to try to reduce the thickness of the area

## 2018-04-07 ENCOUNTER — Ambulatory Visit: Payer: Medicare Other | Admitting: Podiatry

## 2018-04-07 ENCOUNTER — Encounter: Payer: Self-pay | Admitting: Podiatry

## 2018-04-07 DIAGNOSIS — Q828 Other specified congenital malformations of skin: Secondary | ICD-10-CM

## 2018-04-07 DIAGNOSIS — B079 Viral wart, unspecified: Secondary | ICD-10-CM | POA: Diagnosis not present

## 2018-04-08 NOTE — Progress Notes (Signed)
Subjective:   Patient ID: Regina Gilbert, female   DOB: 72 y.o.   MRN: [id number]   HPI Patient presents with a lesion underneath the left foot that is painful when pressed and states that it is making it hard to wear shoe gear comfortably.  Patient stated she noted some pinpoint bleeding   ROS      Objective:  Physical Exam  Neurovascular status intact with patient found to have lesion sub-third metatarsal left that upon debridement shows pinpoint bleeding pain to lateral pressure     Assessment:  Possibility for verruca plantaris plantar left versus porokeratosis     Plan:  Sharp sterile debridement accomplished today and packed with salicylic acid to create response and hopefully reduce the verrucous material.  Reappoint when symptomatic and may require other treatment in future

## 2018-06-15 HISTORY — PX: CATARACT EXTRACTION: SUR2

## 2018-10-04 ENCOUNTER — Other Ambulatory Visit: Payer: Self-pay | Admitting: Obstetrics & Gynecology

## 2018-10-04 DIAGNOSIS — Z1231 Encounter for screening mammogram for malignant neoplasm of breast: Secondary | ICD-10-CM

## 2018-10-21 ENCOUNTER — Encounter: Payer: Medicare Other | Admitting: Obstetrics & Gynecology

## 2018-11-16 HISTORY — PX: CATARACT EXTRACTION: SUR2

## 2018-11-23 ENCOUNTER — Other Ambulatory Visit: Payer: Self-pay

## 2018-11-24 ENCOUNTER — Ambulatory Visit: Payer: Medicare Other | Admitting: Obstetrics & Gynecology

## 2018-11-24 ENCOUNTER — Encounter: Payer: Self-pay | Admitting: Obstetrics & Gynecology

## 2018-11-24 VITALS — BP 126/84 | Ht 61.0 in | Wt 150.0 lb

## 2018-11-24 DIAGNOSIS — Z13820 Encounter for screening for osteoporosis: Secondary | ICD-10-CM | POA: Diagnosis not present

## 2018-11-24 DIAGNOSIS — Z01419 Encounter for gynecological examination (general) (routine) without abnormal findings: Secondary | ICD-10-CM

## 2018-11-24 DIAGNOSIS — Z780 Asymptomatic menopausal state: Secondary | ICD-10-CM

## 2018-11-24 DIAGNOSIS — E663 Overweight: Secondary | ICD-10-CM

## 2018-11-24 NOTE — Progress Notes (Signed)
Regina Gilbert 07-15-1945 [id number]   History:    73 y.o. G0 Married  RP:  Established patient presenting for annual gyn exam   HPI: Postmenopause, well on no HRT.  No postmenopausal bleeding.  No pelvic pain.  Abstinent.  Stopped using estradiol vaginal cream.  Rare urinary incontinence.  Bowel movements normal.  Breast normal.  Body mass index 28.34.  Walking.  Health labs with family physician.  Past medical history,surgical history, family history and social history were all reviewed and documented in the EPIC chart.  Gynecologic History No LMP recorded. Patient is postmenopausal. Contraception: post menopausal status Last Pap: 10/2017. Results were: Negative Last mammogram: 10/2017. Results were: Negative Bone Density: 03/2014 Normal (partial result) Colonoscopy: 2012.  Will contact Dr Earlean Shawl to schedule next [facility].  Obstetric History OB History  Gravida Para Term Preterm AB Living  0            SAB TAB Ectopic Multiple Live Births                ROS: A ROS was performed and pertinent positives and negatives are included in the history.  GENERAL: No fevers or chills. HEENT: No change in vision, no earache, sore throat or sinus congestion. NECK: No pain or stiffness. CARDIOVASCULAR: No chest pain or pressure. No palpitations. PULMONARY: No shortness of breath, cough or wheeze. GASTROINTESTINAL: No abdominal pain, nausea, vomiting or diarrhea, melena or bright red blood per rectum. GENITOURINARY: No urinary frequency, urgency, hesitancy or dysuria. MUSCULOSKELETAL: No joint or muscle pain, no back pain, no recent trauma. DERMATOLOGIC: No rash, no itching, no lesions. ENDOCRINE: No polyuria, polydipsia, no heat or cold intolerance. No recent change in weight. HEMATOLOGICAL: No anemia or easy bruising or bleeding. NEUROLOGIC: No headache, seizures, numbness, tingling or weakness. PSYCHIATRIC: No depression, no loss of interest in normal activity or change in sleep pattern.      Exam:   BP 126/84   Ht 5\' 1"  (1.549 m)   Wt 150 lb (68 kg)   BMI 28.34 kg/m   Body mass index is 28.34 kg/m.  General appearance : Well developed well nourished female. No acute distress HEENT: Eyes: no retinal hemorrhage or exudates,  Neck supple, trachea midline, no carotid bruits, no thyroidmegaly Lungs: Clear to auscultation, no rhonchi or wheezes, or rib retractions  Heart: Regular rate and rhythm, no murmurs or gallops Breast:Examined in sitting and supine position were symmetrical in appearance, no palpable masses or tenderness,  no skin retraction, no nipple inversion, no nipple discharge, no skin discoloration, no axillary or supraclavicular lymphadenopathy Abdomen: no palpable masses or tenderness, no rebound or guarding Extremities: no edema or skin discoloration or tenderness  Pelvic: Vulva: Normal             Vagina: No gross lesions or discharge  Cervix: No gross lesions or discharge  Uterus  AV, normal size, shape and consistency, non-tender and mobile  Adnexa  Without masses or tenderness  Anus: Normal   Assessment/Plan:  73 y.o. female for annual exam   1. Well female exam with routine gynecological exam Normal gynecologic exam.  Last Pap test May 2019 was negative, no indication to repeat this year.  Breast exam normal.  Screening mammogram May 2019 was negative, patient will schedule a screening mammogram now.  Colonoscopy 2 schedule with Dr. Thana Farr.  Health labs with family physician.  2. Postmenopause Well on no hormone replacement therapy.  No postmenopausal bleeding.  3. Screening for osteoporosis Last bone  density October 2015, partial results.  Will schedule a bone density here to screen for osteoporosis.  Recommend vitamin D supplements, calcium intake of 1200 mg daily and regular weightbearing physical activities. - DG Bone Density; Future  4. Overweight (BMI 25.0-29.9) Recommend a slightly lower calorie/carb diet such as [employer] diet.  Aerobic  activities 5 times a week and weightlifting every 2 days.  [name] MD, 12:59 PM 11/24/2018

## 2018-12-02 ENCOUNTER — Encounter: Payer: Self-pay | Admitting: Obstetrics & Gynecology

## 2018-12-02 NOTE — Patient Instructions (Signed)
1. Well female exam with routine gynecological exam Normal gynecologic exam.  Last Pap test May 2019 was negative, no indication to repeat this year.  Breast exam normal.  Screening mammogram May 2019 was negative, patient will schedule a screening mammogram now.  Colonoscopy 2 schedule with Dr. Thana Farr.  Health labs with family physician.  2. Postmenopause Well on no hormone replacement therapy.  No postmenopausal bleeding.  3. Screening for osteoporosis Last bone density October 2015, partial results.  Will schedule a bone density here to screen for osteoporosis.  Recommend vitamin D supplements, calcium intake of 1200 mg daily and regular weightbearing physical activities. - DG Bone Density; Future  4. Overweight (BMI 25.0-29.9) Recommend a slightly lower calorie/carb diet such as [employer].  Aerobic activities 5 times a week and weightlifting every 2 days.  Regina Gilbert, it was a pleasure seeing you today!

## 2018-12-06 ENCOUNTER — Ambulatory Visit
Admission: RE | Admit: 2018-12-06 | Discharge: 2018-12-06 | Disposition: A | Discharge status: Home / Self Care | Payer: Medicare Other | Source: Ambulatory Visit | Attending: Obstetrics & Gynecology | Admitting: Obstetrics & Gynecology

## 2018-12-06 DIAGNOSIS — Z1231 Encounter for screening mammogram for malignant neoplasm of breast: Secondary | ICD-10-CM

## 2018-12-30 ENCOUNTER — Ambulatory Visit: Payer: Medicare Other | Admitting: Podiatry

## 2018-12-30 ENCOUNTER — Encounter: Payer: Self-pay | Admitting: Podiatry

## 2018-12-30 ENCOUNTER — Other Ambulatory Visit: Payer: Self-pay

## 2018-12-30 DIAGNOSIS — Q828 Other specified congenital malformations of skin: Secondary | ICD-10-CM | POA: Diagnosis not present

## 2019-01-05 NOTE — Progress Notes (Signed)
Subjective:   Patient ID: Regina Gilbert, female   DOB: 73 y.o.   MRN: [id number]   HPI Patient presents stating she has a painful lesion bottom of the left foot that is making it hard for her to walk.  Not been seen for over 6 months   ROS      Objective:  Physical Exam  Neurovascular status intact with patient found to have plantar keratotic lesion left foot that is painful when palpated     Assessment:  Chronic lesion left     Plan:  Debridement of lesion accomplished with no iatrogenic bleeding and reappoint for routine care

## 2019-03-09 ENCOUNTER — Other Ambulatory Visit: Payer: Self-pay

## 2019-03-09 DIAGNOSIS — Z20822 Contact with and (suspected) exposure to covid-19: Secondary | ICD-10-CM

## 2019-03-10 LAB — NOVEL CORONAVIRUS, NAA: SARS-CoV-2, NAA: NOT DETECTED

## 2019-06-15 ENCOUNTER — Ambulatory Visit: Payer: Medicare Other | Admitting: Podiatry

## 2019-06-15 ENCOUNTER — Other Ambulatory Visit: Payer: Self-pay

## 2019-06-15 ENCOUNTER — Encounter: Payer: Self-pay | Admitting: Podiatry

## 2019-06-15 DIAGNOSIS — Q828 Other specified congenital malformations of skin: Secondary | ICD-10-CM

## 2019-11-06 ENCOUNTER — Other Ambulatory Visit: Payer: Self-pay | Admitting: Obstetrics & Gynecology

## 2019-11-06 DIAGNOSIS — Z1231 Encounter for screening mammogram for malignant neoplasm of breast: Secondary | ICD-10-CM

## 2019-11-27 ENCOUNTER — Other Ambulatory Visit: Payer: Self-pay

## 2019-11-28 ENCOUNTER — Encounter: Payer: Self-pay | Admitting: Obstetrics & Gynecology

## 2019-11-28 ENCOUNTER — Ambulatory Visit: Payer: Medicare PPO | Admitting: Obstetrics & Gynecology

## 2019-11-28 VITALS — BP 134/80 | Ht 61.5 in | Wt 145.6 lb

## 2019-11-28 DIAGNOSIS — Z780 Asymptomatic menopausal state: Secondary | ICD-10-CM

## 2019-11-28 DIAGNOSIS — Z01419 Encounter for gynecological examination (general) (routine) without abnormal findings: Secondary | ICD-10-CM | POA: Diagnosis not present

## 2019-11-28 DIAGNOSIS — N3946 Mixed incontinence: Secondary | ICD-10-CM | POA: Diagnosis not present

## 2019-11-28 DIAGNOSIS — N952 Postmenopausal atrophic vaginitis: Secondary | ICD-10-CM

## 2019-11-28 MED ORDER — ESTRADIOL 0.1 MG/GM VA CREA: 0.2500 | TOPICAL_CREAM | VAGINAL | 4 refills | Status: DC

## 2019-11-28 NOTE — Progress Notes (Signed)
Regina Gilbert [birth year] [id number]   History:    74 y.o.  G0 Married  RP:  Established patient presenting for annual gyn exam   HPI: Postmenopause, well on no HRT.  No postmenopausal bleeding.  No pelvic pain.  Abstinent. Using Estradiol cream once a week.  Rare mild urinary incontinence with urinary urgency.  Bowel movements normal.  Breast normal. Body mass index 27.07. Walking.  Health labs with family physician.  Cologard Negative 05/2019.   Past medical history,surgical history, family history and social history were all reviewed and documented in the EPIC chart.  Gynecologic History No LMP recorded. Patient is postmenopausal.  Obstetric History OB History  Gravida Para Term Preterm AB Living  0            SAB TAB Ectopic Multiple Live Births                ROS: A ROS was performed and pertinent positives and negatives are included in the history.  GENERAL: No fevers or chills. HEENT: No change in vision, no earache, sore throat or sinus congestion. NECK: No pain or stiffness. CARDIOVASCULAR: No chest pain or pressure. No palpitations. PULMONARY: No shortness of breath, cough or wheeze. GASTROINTESTINAL: No abdominal pain, nausea, vomiting or diarrhea, melena or bright red blood per rectum. GENITOURINARY: No urinary frequency, urgency, hesitancy or dysuria. MUSCULOSKELETAL: No joint or muscle pain, no back pain, no recent trauma. DERMATOLOGIC: No rash, no itching, no lesions. ENDOCRINE: No polyuria, polydipsia, no heat or cold intolerance. No recent change in weight. HEMATOLOGICAL: No anemia or easy bruising or bleeding. NEUROLOGIC: No headache, seizures, numbness, tingling or weakness. PSYCHIATRIC: No depression, no loss of interest in normal activity or change in sleep pattern.     Exam:   BP 134/80   Ht 5' 1.5" (1.562 m)   Wt 145 lb 9.6 oz (66 kg)   BMI 27.07 kg/m   Body mass index is 27.07 kg/m.  General appearance : Well developed well nourished female. No  acute distress HEENT: Eyes: no retinal hemorrhage or exudates,  Neck supple, trachea midline, no carotid bruits, no thyroidmegaly Lungs: Clear to auscultation, no rhonchi or wheezes, or rib retractions  Heart: Regular rate and rhythm, no murmurs or gallops Breast:Examined in sitting and supine position were symmetrical in appearance, no palpable masses or tenderness,  no skin retraction, no nipple inversion, no nipple discharge, no skin discoloration, no axillary or supraclavicular lymphadenopathy Abdomen: no palpable masses or tenderness, no rebound or guarding Extremities: no edema or skin discoloration or tenderness  Pelvic: Vulva: Normal             Vagina: No gross lesions or discharge  Cervix: No gross lesions or discharge  Uterus  AV, normal size, shape and consistency, non-tender and mobile  Adnexa  Without masses or tenderness  Anus: Normal   Assessment/Plan:  74 y.o. female for annual exam   1. Well female exam with routine gynecological exam Normal gynecologic exam in menopause.  No indication to repeat a Pap test at this time.  Breast exam normal.  Screening mammogram June 2020 was negative will repeat now.  Cologuard December 2020.  Health labs with family physician.  Body mass index 27.07.  Continue with fitness and healthy nutrition.  2. Postmenopause Well on no hormone replacement therapy.  No postmenopausal bleeding.  Reports of bone density at the hip in 2015 was normal.  Patient will verify when a bone density was repeated after that and the  results through her family physician.  Vitamin D supplements, calcium and take of 1200 mg daily and regular weightbearing physical activity is recommended.  3. Post-menopausal atrophic vaginitis Mild urinary incontinence and urgency.  Well on estradiol cream once a week.  No contraindication.  We will continue with that treatment.  Prescription sent to pharmacy.  4. Mixed stress and urge urinary incontinence Mild urinary  incontinence and urgency.  Counseling on incontinence and urgency done including regular emptying of the bladder to avoid overfilling, avoid pelvic pressure and reinforcement of the pelvic floor with Kegel exercises.  Avoiding bladder irritants such as caffeine also recommended.   . Other orders - estradiol (ESTRACE VAGINAL) 0.1 MG/GM vaginal cream; Place 0.25 Applicatorfuls vaginally once a week.   Genia Del MD, 9:16 AM 11/28/2019

## 2019-11-29 ENCOUNTER — Encounter: Payer: Self-pay | Admitting: Obstetrics & Gynecology

## 2019-11-29 NOTE — Patient Instructions (Addendum)
1. Well female exam with routine gynecological exam Normal gynecologic exam in menopause.  No indication to repeat a Pap test at this time.  Breast exam normal.  Screening mammogram June 2020 was negative will repeat now.  Cologuard December 2020.  Health labs with family physician.  Body mass index 27.07.  Continue with fitness and healthy nutrition.  2. Postmenopause Well on no hormone replacement therapy.  No postmenopausal bleeding.  Reports of bone density at the hip in 2015 was normal.  Patient will verify when a bone density was repeated after that and the results through her family physician.  Vitamin D supplements, calcium and take of 1200 mg daily and regular weightbearing physical activity is recommended.  3. Post-menopausal atrophic vaginitis Mild urinary incontinence and urgency.  Well on estradiol cream once a week.  No contraindication.  We will continue with that treatment.  Prescription sent to pharmacy.  4. Mixed stress and urge urinary incontinence Mild urinary incontinence and urgency.  Counseling on incontinence and urgency done including regular emptying of the bladder to avoid overfilling, avoid pelvic pressure and reinforcement of the pelvic floor with Kegel exercises.  Avoiding bladder irritants such as caffeine also recommended.   . Other orders - estradiol (ESTRACE VAGINAL) 0.1 MG/GM vaginal cream; Place 0.25 Applicatorfuls vaginally once a week.   Mykaylah, it was a pleasure seeing you today!

## 2019-12-08 ENCOUNTER — Other Ambulatory Visit: Payer: Self-pay

## 2019-12-08 ENCOUNTER — Ambulatory Visit
Admission: RE | Admit: 2019-12-08 | Discharge: 2019-12-08 | Disposition: A | Discharge status: Home / Self Care | Payer: Medicare PPO | Source: Ambulatory Visit | Attending: Obstetrics & Gynecology | Admitting: Obstetrics & Gynecology

## 2019-12-08 DIAGNOSIS — Z1231 Encounter for screening mammogram for malignant neoplasm of breast: Secondary | ICD-10-CM

## 2019-12-19 ENCOUNTER — Other Ambulatory Visit: Payer: Self-pay

## 2019-12-19 ENCOUNTER — Ambulatory Visit (INDEPENDENT_AMBULATORY_CARE_PROVIDER_SITE_OTHER): Payer: Medicare PPO

## 2019-12-19 ENCOUNTER — Other Ambulatory Visit: Payer: Self-pay | Admitting: Obstetrics & Gynecology

## 2019-12-19 DIAGNOSIS — M8589 Other specified disorders of bone density and structure, multiple sites: Secondary | ICD-10-CM

## 2019-12-19 DIAGNOSIS — Z780 Asymptomatic menopausal state: Secondary | ICD-10-CM

## 2019-12-19 DIAGNOSIS — Z13820 Encounter for screening for osteoporosis: Secondary | ICD-10-CM

## 2020-03-11 ENCOUNTER — Ambulatory Visit (INDEPENDENT_AMBULATORY_CARE_PROVIDER_SITE_OTHER): Payer: Medicare PPO | Admitting: Podiatry

## 2020-03-11 ENCOUNTER — Encounter: Payer: Self-pay | Admitting: Podiatry

## 2020-03-11 ENCOUNTER — Other Ambulatory Visit: Payer: Self-pay

## 2020-03-11 DIAGNOSIS — Q828 Other specified congenital malformations of skin: Secondary | ICD-10-CM

## 2020-03-12 NOTE — Progress Notes (Signed)
Subjective:   Patient ID: Regina Gilbert, female   DOB: 74 y.o.   MRN: [id number]   HPI Patient presents with severe lesion bottom of the left foot stating its been bothering her for several months but overall did well for a good period of time   ROS      Objective:  Physical Exam  Neurovascular status intact with severe keratotic lesion plantar aspect left foot around the metatarsal with lucent core and pain to palpation     Assessment:  Acute keratotic lesion plantar left with pain consistent with porokeratosis     Plan:  H&P reviewed condition and did deep debridement of the lesion was sterile instrumentation applied medication to soften it with padding and reappoint as symptoms indicate

## 2020-04-19 ENCOUNTER — Ambulatory Visit: Payer: Medicare PPO | Attending: Internal Medicine

## 2020-04-19 DIAGNOSIS — Z23 Encounter for immunization: Secondary | ICD-10-CM

## 2020-04-19 NOTE — Progress Notes (Signed)
° °  Covid-19 Vaccination Clinic  Name:  Lillyn Wieczorek    MRN: [id number] DOB: Jul 17, 1945  04/19/2020  Ms. Whinery was observed post Covid-19 immunization for 15 minutes without incident. She was provided with Vaccine Information Sheet and instruction to access the V-Safe system.   Ms. Pierron was instructed to call 911 with any severe reactions post vaccine:  Difficulty breathing   Swelling of face and throat   A fast heartbeat   A bad rash all over body   Dizziness and weakness

## 2020-05-31 ENCOUNTER — Encounter: Payer: Self-pay | Admitting: Podiatry

## 2020-05-31 ENCOUNTER — Ambulatory Visit: Payer: Medicare PPO | Admitting: Podiatry

## 2020-05-31 ENCOUNTER — Other Ambulatory Visit: Payer: Self-pay

## 2020-05-31 DIAGNOSIS — Q828 Other specified congenital malformations of skin: Secondary | ICD-10-CM

## 2020-05-31 NOTE — Progress Notes (Signed)
Subjective:   Patient ID: Regina Gilbert, female   DOB: 74 y.o.   MRN: [id number]   HPI Patient presents stating he has a lesion underneath the left foot that is been painful   ROS      Objective:  Physical Exam  Neurovascular status intact lesion subleft painful when pressed on the bone structure     Assessment:  Chronic lesion left secondary to pressure     Plan:  Sterile sharp debridement accomplished no iatrogenic bleeding reappoint routine care

## 2020-08-22 ENCOUNTER — Ambulatory Visit: Payer: Medicare PPO | Admitting: Podiatry

## 2020-08-22 ENCOUNTER — Encounter: Payer: Self-pay | Admitting: Podiatry

## 2020-08-22 ENCOUNTER — Other Ambulatory Visit: Payer: Self-pay

## 2020-08-22 DIAGNOSIS — Q828 Other specified congenital malformations of skin: Secondary | ICD-10-CM | POA: Diagnosis not present

## 2020-08-24 NOTE — Progress Notes (Signed)
Subjective:   Patient ID: Regina Gilbert, female   DOB: 75 y.o.   MRN: [id number]   HPI Patient presents with chronic lesion bottom of the left foot that has to be worked on periodically and is painful   ROS      Objective:  Physical Exam  Neurovascular status intact with lesion plantar left keratotic painful     Assessment:  Chronic lesion formation plantar left with pain     Plan:  Reviewed condition debrided lesion reappoint routine care

## 2020-11-08 ENCOUNTER — Other Ambulatory Visit: Payer: Self-pay | Admitting: Internal Medicine

## 2020-11-08 DIAGNOSIS — Z1231 Encounter for screening mammogram for malignant neoplasm of breast: Secondary | ICD-10-CM

## 2020-11-14 ENCOUNTER — Encounter: Payer: Self-pay | Admitting: Podiatry

## 2020-11-14 ENCOUNTER — Other Ambulatory Visit: Payer: Self-pay

## 2020-11-14 ENCOUNTER — Ambulatory Visit (INDEPENDENT_AMBULATORY_CARE_PROVIDER_SITE_OTHER): Payer: Medicare PPO | Admitting: Podiatry

## 2020-11-14 DIAGNOSIS — Q828 Other specified congenital malformations of skin: Secondary | ICD-10-CM

## 2020-11-14 NOTE — Progress Notes (Signed)
Subjective:   Patient ID: Regina Gilbert, female   DOB: 75 y.o.   MRN: [id number]   HPI Patient presents with chronic lesion left x2 painful   ROS      Objective:  Physical Exam  Muscular status intact keratotic lesion subsecond fourth metatarsals left lucent core painful     Assessment:  Chronic porokeratotic lesions left with pain     Plan:  Sterile sharp debridement of lesions accomplished no iatrogenic bleeding reappoint routine care

## 2020-11-28 ENCOUNTER — Other Ambulatory Visit: Payer: Self-pay

## 2020-11-28 ENCOUNTER — Ambulatory Visit (INDEPENDENT_AMBULATORY_CARE_PROVIDER_SITE_OTHER): Payer: Medicare PPO | Admitting: Obstetrics & Gynecology

## 2020-11-28 ENCOUNTER — Encounter: Payer: Self-pay | Admitting: Obstetrics & Gynecology

## 2020-11-28 VITALS — BP 140/86 | Ht 61.0 in | Wt 139.0 lb

## 2020-11-28 DIAGNOSIS — N952 Postmenopausal atrophic vaginitis: Secondary | ICD-10-CM

## 2020-11-28 DIAGNOSIS — Z01419 Encounter for gynecological examination (general) (routine) without abnormal findings: Secondary | ICD-10-CM | POA: Diagnosis not present

## 2020-11-28 DIAGNOSIS — Z780 Asymptomatic menopausal state: Secondary | ICD-10-CM

## 2020-11-28 MED ORDER — ESTRADIOL 0.1 MG/GM VA CREA: 0.2500 | TOPICAL_CREAM | VAGINAL | 4 refills | Status: DC

## 2020-11-28 NOTE — Progress Notes (Signed)
Regina Gilbert 04-15-1946 [id number]   History:    75 y.o.  G0 Married   RP:  Established patient presenting for annual gyn exam   HPI: Postmenopause, well on no HRT.  No postmenopausal bleeding.  No pelvic pain.  Abstinent. Using Estradiol cream once a week.  Urine and  Bowel movements normal.  Breast normal. Body mass index 26.26. Walking.  Health labs with family physician.  Cologard Negative 05/2019.   Past medical history,surgical history, family history and social history were all reviewed and documented in the EPIC chart.  Gynecologic History No LMP recorded. Patient is postmenopausal.  Obstetric History OB History  Gravida Para Term Preterm AB Living  0            SAB IAB Ectopic Multiple Live Births                ROS: A ROS was performed and pertinent positives and negatives are included in the history.  GENERAL: No fevers or chills. HEENT: No change in vision, no earache, sore throat or sinus congestion. NECK: No pain or stiffness. CARDIOVASCULAR: No chest pain or pressure. No palpitations. PULMONARY: No shortness of breath, cough or wheeze. GASTROINTESTINAL: No abdominal pain, nausea, vomiting or diarrhea, melena or bright red blood per rectum. GENITOURINARY: No urinary frequency, urgency, hesitancy or dysuria. MUSCULOSKELETAL: No joint or muscle pain, no back pain, no recent trauma. DERMATOLOGIC: No rash, no itching, no lesions. ENDOCRINE: No polyuria, polydipsia, no heat or cold intolerance. No recent change in weight. HEMATOLOGICAL: No anemia or easy bruising or bleeding. NEUROLOGIC: No headache, seizures, numbness, tingling or weakness. PSYCHIATRIC: No depression, no loss of interest in normal activity or change in sleep pattern.     Exam:   BP 140/86   Ht 5\' 1"  (1.549 m)   Wt 139 lb (63 kg)   BMI 26.26 kg/m   Body mass index is 26.26 kg/m.  General appearance : Well developed well nourished female. No acute distress HEENT: Eyes: no retinal  hemorrhage or exudates,  Neck supple, trachea midline, no carotid bruits, no thyroidmegaly Lungs: Clear to auscultation, no rhonchi or wheezes, or rib retractions  Heart: Regular rate and rhythm, no murmurs or gallops Breast:Examined in sitting and supine position were symmetrical in appearance, no palpable masses or tenderness,  no skin retraction, no nipple inversion, no nipple discharge, no skin discoloration, no axillary or supraclavicular lymphadenopathy Abdomen: no palpable masses or tenderness, no rebound or guarding Extremities: no edema or skin discoloration or tenderness  Pelvic: Vulva: Normal             Vagina: No gross lesions or discharge  Cervix: No gross lesions or discharge  Uterus  AV, normal size, shape and consistency, non-tender and mobile  Adnexa  Without masses or tenderness  Anus: Normal   Assessment/Plan:  75 y.o. female for annual exam   1. Well female exam with routine gynecological exam Normal gynecologic exam in menopause.  History of normal Pap tests, last Pap test in 2019 was negative, and patient is abstinent.  No indication for Pap test at this time.  Breast exam normal.  Screening mammogram June 2021 was negative, schedule for a screening mammogram in July.  Cologuard negative in December 2020.  Health labs with family physician.  Good body mass index at 26.26.  Continue with fitness and healthy nutrition.  2. Postmenopause Well on no systemic hormone replacement therapy.  No postmenopausal bleeding.  3. Post-menopausal atrophic vaginitis Well  on estradiol cream a quarter of an applicator once a week.  No contraindication to continue.  Prescription sent to pharmacy.  Other orders - estradiol (ESTRACE VAGINAL) 0.1 MG/GM vaginal cream; Place 0.25 Applicatorfuls vaginally once a week.   Genia Del MD, 9:38 AM 11/28/2020

## 2021-01-06 ENCOUNTER — Ambulatory Visit
Admission: RE | Admit: 2021-01-06 | Discharge: 2021-01-06 | Disposition: A | Discharge status: Home / Self Care | Payer: Medicare PPO | Source: Ambulatory Visit | Attending: Internal Medicine | Admitting: Internal Medicine

## 2021-01-06 ENCOUNTER — Other Ambulatory Visit: Payer: Self-pay

## 2021-01-06 DIAGNOSIS — Z1231 Encounter for screening mammogram for malignant neoplasm of breast: Secondary | ICD-10-CM | POA: Diagnosis not present

## 2021-01-17 DIAGNOSIS — H524 Presbyopia: Secondary | ICD-10-CM | POA: Diagnosis not present

## 2021-01-17 DIAGNOSIS — H43813 Vitreous degeneration, bilateral: Secondary | ICD-10-CM | POA: Diagnosis not present

## 2021-01-17 DIAGNOSIS — H26493 Other secondary cataract, bilateral: Secondary | ICD-10-CM | POA: Diagnosis not present

## 2021-01-17 DIAGNOSIS — H52203 Unspecified astigmatism, bilateral: Secondary | ICD-10-CM | POA: Diagnosis not present

## 2021-01-23 ENCOUNTER — Encounter: Payer: Self-pay | Admitting: Podiatry

## 2021-01-23 ENCOUNTER — Ambulatory Visit: Payer: Medicare PPO | Admitting: Podiatry

## 2021-01-23 ENCOUNTER — Other Ambulatory Visit: Payer: Self-pay

## 2021-01-23 DIAGNOSIS — Q828 Other specified congenital malformations of skin: Secondary | ICD-10-CM | POA: Diagnosis not present

## 2021-01-23 NOTE — Progress Notes (Signed)
Subjective:   Patient ID: Regina Gilbert, female   DOB: 75 y.o.   MRN: [id number]   HPI Patient presents with painful lesions plantar aspect left that are hard for her to walk on   ROS      Objective:  Physical Exam  Neurovascular status intact keratotic lesion with lucent cords x2 left very painful     Assessment:  Chronic lesion formation plantar aspect left     Plan:  Debridement of lesions no iatrogenic bleeding reappoint routine care

## 2021-02-05 DIAGNOSIS — M810 Age-related osteoporosis without current pathological fracture: Secondary | ICD-10-CM | POA: Diagnosis not present

## 2021-02-05 DIAGNOSIS — E785 Hyperlipidemia, unspecified: Secondary | ICD-10-CM | POA: Diagnosis not present

## 2021-02-12 DIAGNOSIS — Z1331 Encounter for screening for depression: Secondary | ICD-10-CM | POA: Diagnosis not present

## 2021-02-12 DIAGNOSIS — J301 Allergic rhinitis due to pollen: Secondary | ICD-10-CM | POA: Diagnosis not present

## 2021-02-12 DIAGNOSIS — M810 Age-related osteoporosis without current pathological fracture: Secondary | ICD-10-CM | POA: Diagnosis not present

## 2021-02-12 DIAGNOSIS — I10 Essential (primary) hypertension: Secondary | ICD-10-CM | POA: Diagnosis not present

## 2021-02-12 DIAGNOSIS — Z1389 Encounter for screening for other disorder: Secondary | ICD-10-CM | POA: Diagnosis not present

## 2021-02-12 DIAGNOSIS — E785 Hyperlipidemia, unspecified: Secondary | ICD-10-CM | POA: Diagnosis not present

## 2021-02-12 DIAGNOSIS — Z0000 Encounter for general adult medical examination without abnormal findings: Secondary | ICD-10-CM | POA: Diagnosis not present

## 2021-02-22 DIAGNOSIS — R32 Unspecified urinary incontinence: Secondary | ICD-10-CM | POA: Diagnosis not present

## 2021-02-22 DIAGNOSIS — Z809 Family history of malignant neoplasm, unspecified: Secondary | ICD-10-CM | POA: Diagnosis not present

## 2021-02-22 DIAGNOSIS — N951 Menopausal and female climacteric states: Secondary | ICD-10-CM | POA: Diagnosis not present

## 2021-02-22 DIAGNOSIS — I10 Essential (primary) hypertension: Secondary | ICD-10-CM | POA: Diagnosis not present

## 2021-02-22 DIAGNOSIS — Z79890 Hormone replacement therapy (postmenopausal): Secondary | ICD-10-CM | POA: Diagnosis not present

## 2021-02-22 DIAGNOSIS — Z8249 Family history of ischemic heart disease and other diseases of the circulatory system: Secondary | ICD-10-CM | POA: Diagnosis not present

## 2021-02-22 DIAGNOSIS — E785 Hyperlipidemia, unspecified: Secondary | ICD-10-CM | POA: Diagnosis not present

## 2021-03-28 DIAGNOSIS — K1321 Leukoplakia of oral mucosa, including tongue: Secondary | ICD-10-CM | POA: Diagnosis not present

## 2021-03-28 DIAGNOSIS — D101 Benign neoplasm of tongue: Secondary | ICD-10-CM | POA: Diagnosis not present

## 2021-03-28 DIAGNOSIS — K143 Hypertrophy of tongue papillae: Secondary | ICD-10-CM | POA: Diagnosis not present

## 2021-03-28 DIAGNOSIS — K136 Irritative hyperplasia of oral mucosa: Secondary | ICD-10-CM | POA: Diagnosis not present

## 2021-04-07 IMAGING — MG DIGITAL SCREENING BILAT W/ TOMO W/ CAD
8 series · 9 of 24 positions shown · non-contrast
Comparison: Previous exam(s).

CLINICAL DATA: Screening.

EXAM:
DIGITAL SCREENING BILATERAL MAMMOGRAM WITH TOMO AND CAD

[R CC synth-2D]
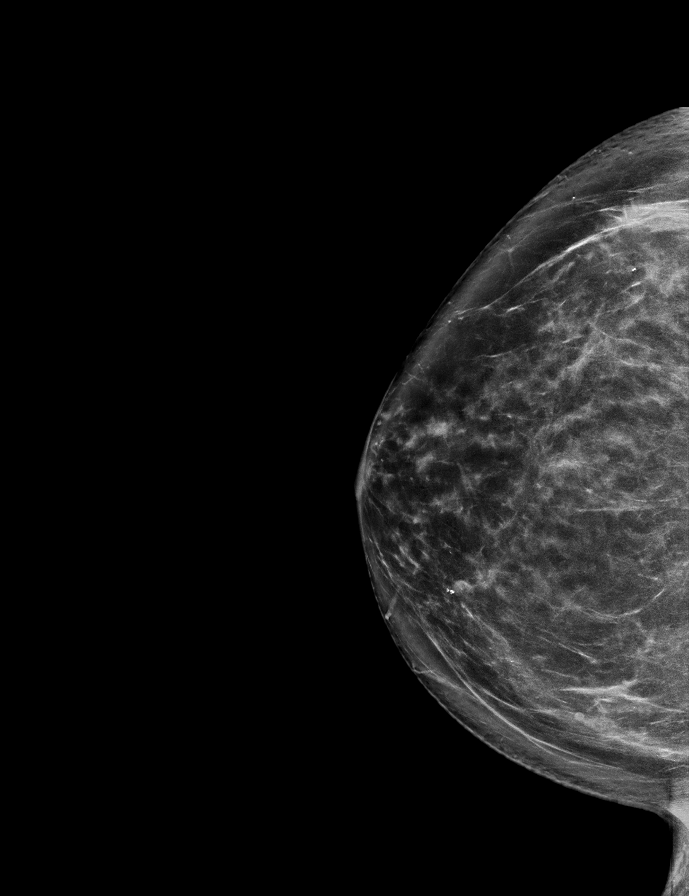

[L CC synth-2D]
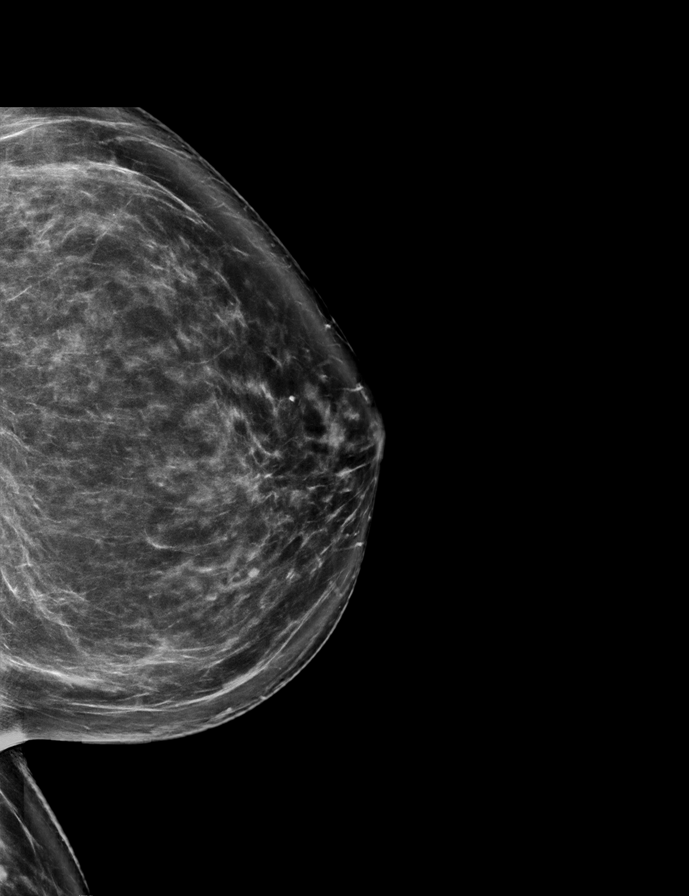

[L MLO synth-2D]
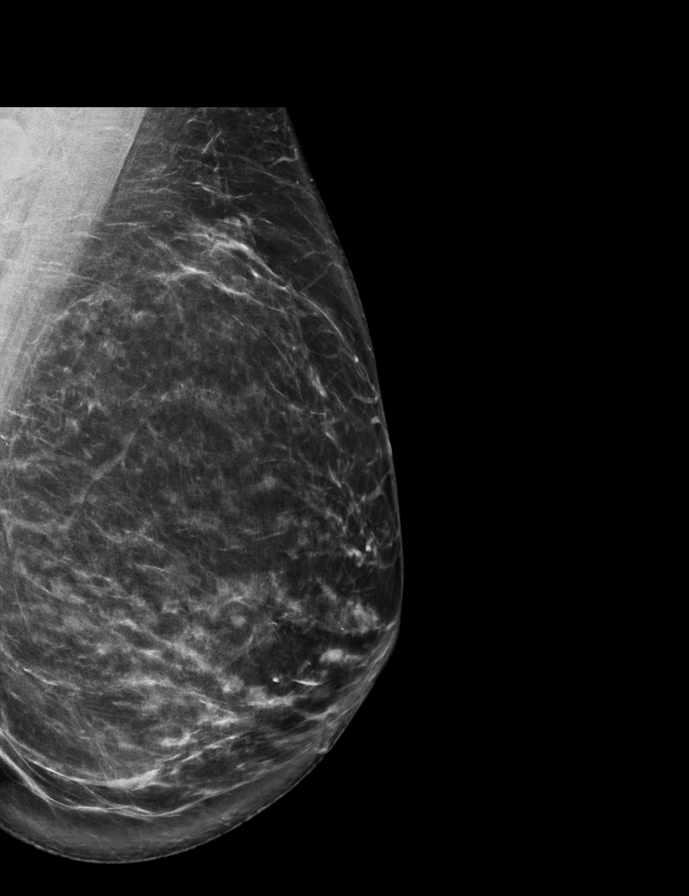

[R MLO synth-2D]
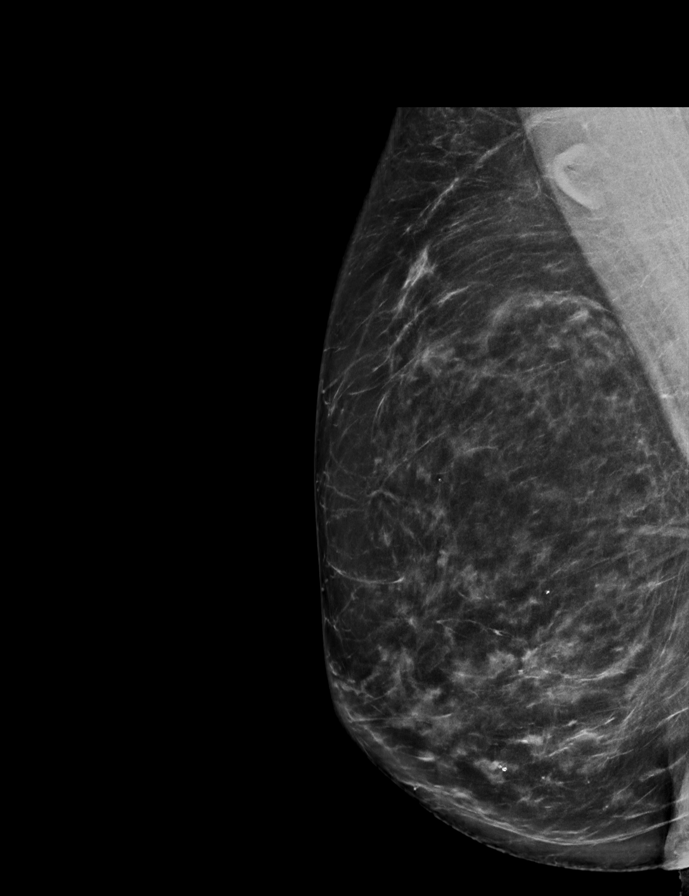

[L MLO tomo · 2 of 76 frames shown]
[frame 25/76]
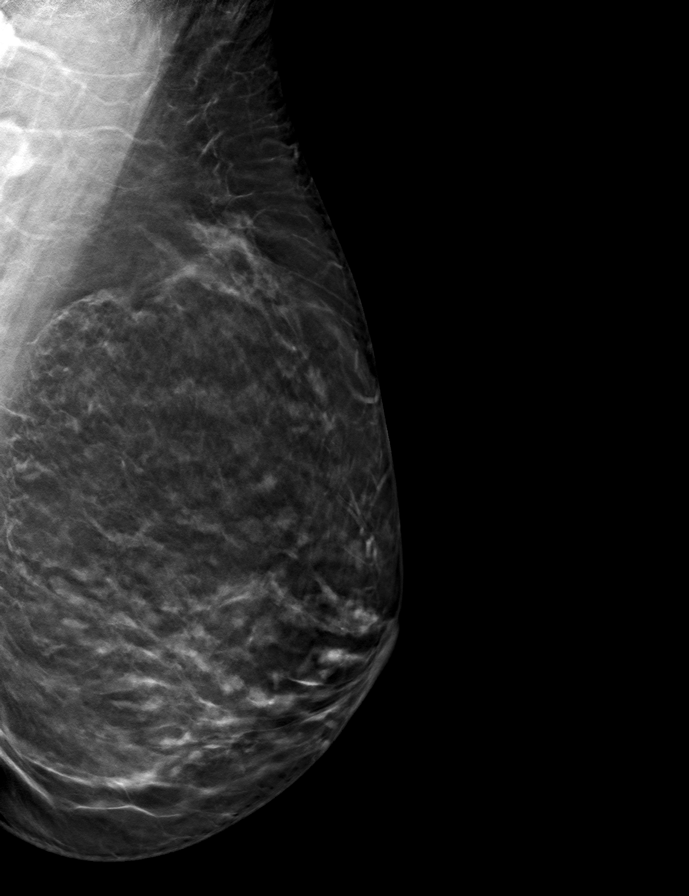
[frame 39/76]
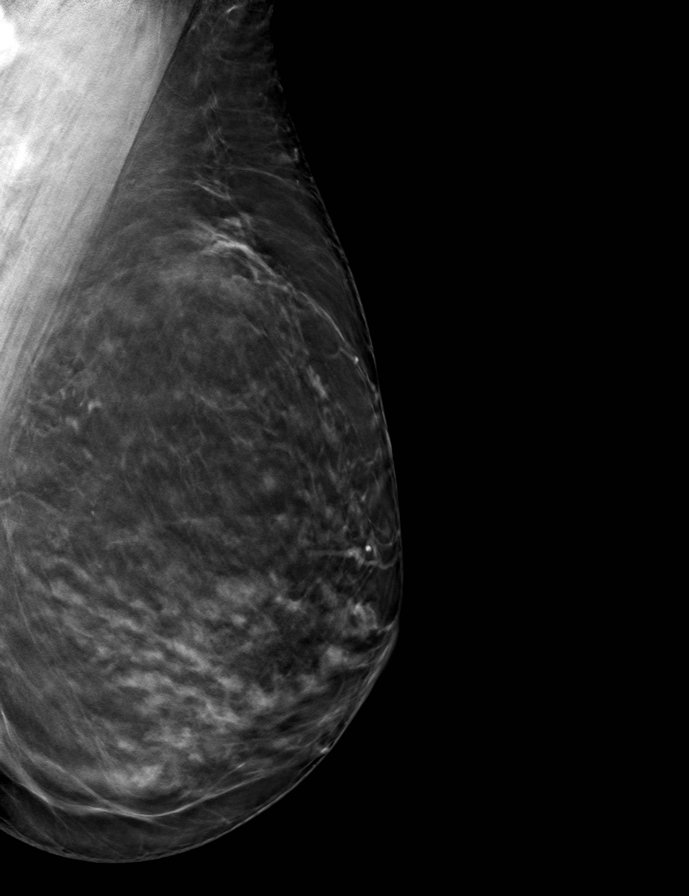

[R CC tomo · tomo slice 41/82.0]
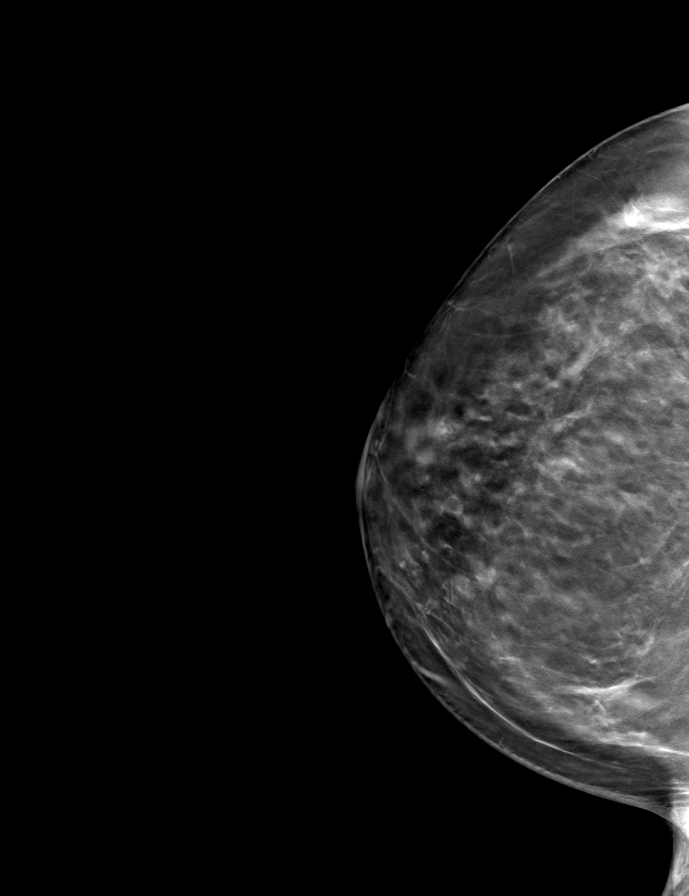

[R MLO tomo · tomo slice 41/81.0]
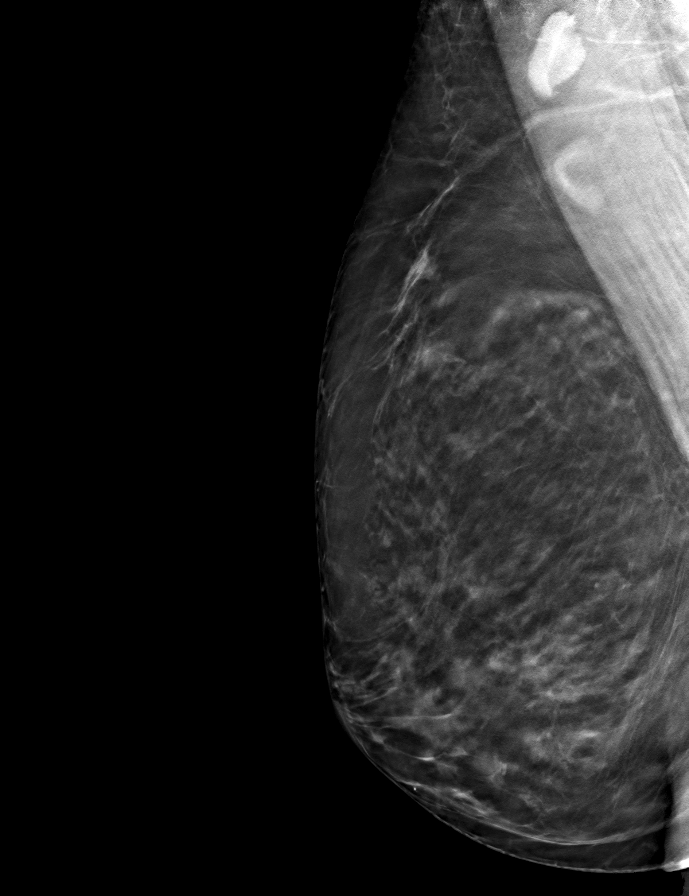

[L CC tomo · tomo slice 43/85.0]
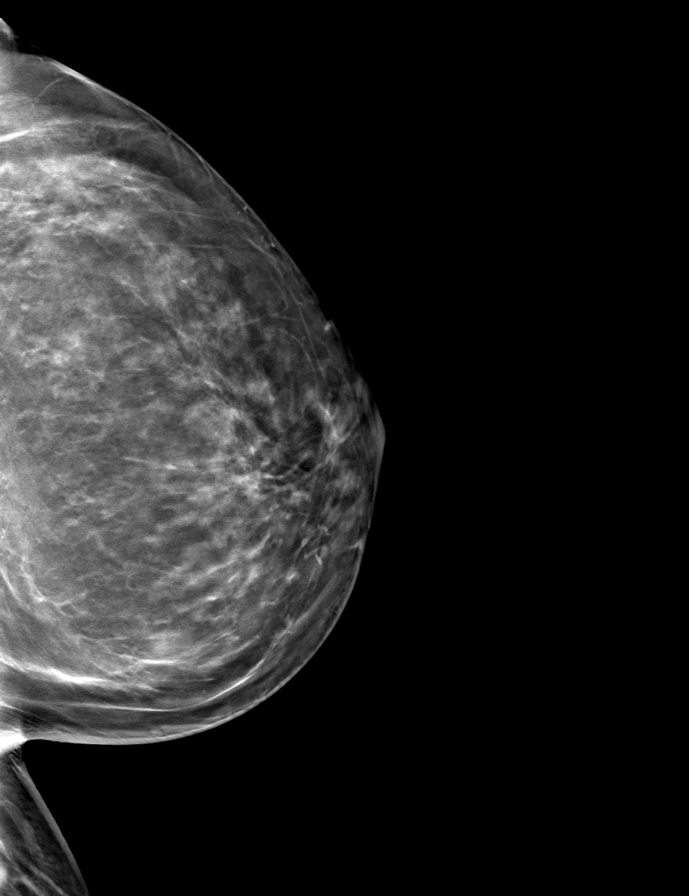

[9 of 24 positions shown; findings below may reference images not displayed]

ACR Breast Density Category c: The breast tissue is heterogeneously
dense, which may obscure small masses.
FINDINGS: There are no findings suspicious for malignancy. Images were
processed with CAD.
IMPRESSION: No mammographic evidence of malignancy. A result letter of this
screening mammogram will be mailed directly to the patient.

RECOMMENDATION:
Screening mammogram in one year. (Code:FT-U-LHB)

BI-RADS CATEGORY  1: Negative.

## 2021-04-25 ENCOUNTER — Ambulatory Visit (INDEPENDENT_AMBULATORY_CARE_PROVIDER_SITE_OTHER): Payer: Medicare PPO

## 2021-04-25 ENCOUNTER — Other Ambulatory Visit: Payer: Self-pay

## 2021-04-25 ENCOUNTER — Ambulatory Visit: Payer: Medicare PPO | Admitting: Podiatry

## 2021-04-25 ENCOUNTER — Encounter: Payer: Self-pay | Admitting: Podiatry

## 2021-04-25 DIAGNOSIS — L84 Corns and callosities: Secondary | ICD-10-CM

## 2021-04-25 DIAGNOSIS — Q828 Other specified congenital malformations of skin: Secondary | ICD-10-CM

## 2021-04-25 DIAGNOSIS — L989 Disorder of the skin and subcutaneous tissue, unspecified: Secondary | ICD-10-CM

## 2021-04-27 NOTE — Progress Notes (Signed)
Subjective:   Patient ID: Regina Gilbert, female   DOB: 75 y.o.   MRN: [id number]   HPI Patient presents chronic keratotic lesion x2 plantar left foot pain   ROS      Objective:  Physical Exam  Neurovascular status intact significant lesion formation plantar left painful when pressed     Assessment:  Chronic porokeratotic lesion plantar aspect left      Plan:  H&P debrided lesion no iatrogenic bleeding applied dressing reappoint as needed with symptoms

## 2021-05-01 ENCOUNTER — Other Ambulatory Visit: Payer: Self-pay | Admitting: Podiatry

## 2021-05-01 DIAGNOSIS — L989 Disorder of the skin and subcutaneous tissue, unspecified: Secondary | ICD-10-CM

## 2021-08-11 DIAGNOSIS — Z8249 Family history of ischemic heart disease and other diseases of the circulatory system: Secondary | ICD-10-CM | POA: Diagnosis not present

## 2021-08-11 DIAGNOSIS — Z809 Family history of malignant neoplasm, unspecified: Secondary | ICD-10-CM | POA: Diagnosis not present

## 2021-08-11 DIAGNOSIS — I10 Essential (primary) hypertension: Secondary | ICD-10-CM | POA: Diagnosis not present

## 2021-08-11 DIAGNOSIS — E785 Hyperlipidemia, unspecified: Secondary | ICD-10-CM | POA: Diagnosis not present

## 2021-08-11 DIAGNOSIS — R32 Unspecified urinary incontinence: Secondary | ICD-10-CM | POA: Diagnosis not present

## 2021-08-11 DIAGNOSIS — H269 Unspecified cataract: Secondary | ICD-10-CM | POA: Diagnosis not present

## 2021-10-21 ENCOUNTER — Other Ambulatory Visit: Payer: Self-pay

## 2021-10-21 DIAGNOSIS — M8580 Other specified disorders of bone density and structure, unspecified site: Secondary | ICD-10-CM

## 2021-12-04 ENCOUNTER — Encounter: Payer: Self-pay | Admitting: Obstetrics & Gynecology

## 2021-12-04 ENCOUNTER — Ambulatory Visit (INDEPENDENT_AMBULATORY_CARE_PROVIDER_SITE_OTHER): Payer: Medicare PPO | Admitting: Obstetrics & Gynecology

## 2021-12-04 VITALS — BP 110/66 | HR 71 | Ht 61.25 in | Wt 143.0 lb

## 2021-12-04 DIAGNOSIS — M8580 Other specified disorders of bone density and structure, unspecified site: Secondary | ICD-10-CM

## 2021-12-04 DIAGNOSIS — Z01419 Encounter for gynecological examination (general) (routine) without abnormal findings: Secondary | ICD-10-CM | POA: Diagnosis not present

## 2021-12-04 DIAGNOSIS — Z780 Asymptomatic menopausal state: Secondary | ICD-10-CM

## 2021-12-12 ENCOUNTER — Other Ambulatory Visit: Payer: Self-pay | Admitting: Internal Medicine

## 2021-12-12 DIAGNOSIS — Z1231 Encounter for screening mammogram for malignant neoplasm of breast: Secondary | ICD-10-CM

## 2021-12-23 ENCOUNTER — Ambulatory Visit (INDEPENDENT_AMBULATORY_CARE_PROVIDER_SITE_OTHER): Payer: Medicare PPO

## 2021-12-23 ENCOUNTER — Other Ambulatory Visit: Payer: Self-pay | Admitting: Obstetrics & Gynecology

## 2021-12-23 DIAGNOSIS — M810 Age-related osteoporosis without current pathological fracture: Secondary | ICD-10-CM | POA: Diagnosis not present

## 2021-12-23 DIAGNOSIS — Z780 Asymptomatic menopausal state: Secondary | ICD-10-CM

## 2021-12-23 DIAGNOSIS — Z13820 Encounter for screening for osteoporosis: Secondary | ICD-10-CM

## 2021-12-23 DIAGNOSIS — M8580 Other specified disorders of bone density and structure, unspecified site: Secondary | ICD-10-CM

## 2022-01-01 ENCOUNTER — Encounter: Payer: Self-pay | Admitting: Podiatry

## 2022-01-01 ENCOUNTER — Ambulatory Visit: Payer: Medicare PPO | Admitting: Podiatry

## 2022-01-01 DIAGNOSIS — Q828 Other specified congenital malformations of skin: Secondary | ICD-10-CM

## 2022-01-01 NOTE — Progress Notes (Signed)
Subjective:   Patient ID: Regina Gilbert, female   DOB: 76 y.o.   MRN: [id number]   HPI Patient presents with very painful lesion plantar aspect left foot that she has been trying to get in for this but also has a husband who has pancreatic cancer that she has had to be with  ROS      Objective:  Physical Exam  Neurovascular status intact with keratotic lesion plantar left that has a lucent core within it     Assessment:  Porokeratotic lesion plantar left     Plan:  Sterile debridement of the area removed and applied chemical to soften the skin and we will see back as needed

## 2022-01-09 ENCOUNTER — Ambulatory Visit: Payer: Medicare PPO

## 2022-01-12 ENCOUNTER — Ambulatory Visit
Admission: RE | Admit: 2022-01-12 | Discharge: 2022-01-12 | Disposition: A | Discharge status: Home / Self Care | Payer: Medicare PPO | Source: Ambulatory Visit | Attending: Internal Medicine | Admitting: Internal Medicine

## 2022-01-12 DIAGNOSIS — Z1231 Encounter for screening mammogram for malignant neoplasm of breast: Secondary | ICD-10-CM

## 2022-01-14 ENCOUNTER — Other Ambulatory Visit: Payer: Self-pay | Admitting: Internal Medicine

## 2022-01-14 DIAGNOSIS — R928 Other abnormal and inconclusive findings on diagnostic imaging of breast: Secondary | ICD-10-CM

## 2022-01-19 ENCOUNTER — Telehealth: Payer: Self-pay

## 2022-01-19 DIAGNOSIS — H26493 Other secondary cataract, bilateral: Secondary | ICD-10-CM | POA: Diagnosis not present

## 2022-01-19 DIAGNOSIS — H52203 Unspecified astigmatism, bilateral: Secondary | ICD-10-CM | POA: Diagnosis not present

## 2022-01-19 DIAGNOSIS — H43813 Vitreous degeneration, bilateral: Secondary | ICD-10-CM | POA: Diagnosis not present

## 2022-01-19 DIAGNOSIS — H524 Presbyopia: Secondary | ICD-10-CM | POA: Diagnosis not present

## 2022-01-19 NOTE — Telephone Encounter (Signed)
My Chart message returned unread:  Regina Gilbert,   The results of your recent bone density test showed Osteoporosis.Dr. Seymour Bars recommended that you schedule an appointment with her to come in to the office and discuss these results and recommendations.   May I have someone from our appointment desk call you to arrange an appointment?   I have attached your bone density test results here."    I called patient and left message that I had sent My Chart message with her BD results and Dr. Simon Rhein recommendation for OV due to osteoporosis.  I asked her to login and read message and reply or to call us back.

## 2022-01-30 ENCOUNTER — Ambulatory Visit: Admission: RE | Admit: 2022-01-30 | Payer: Medicare PPO | Source: Ambulatory Visit

## 2022-01-30 ENCOUNTER — Ambulatory Visit
Admission: RE | Admit: 2022-01-30 | Discharge: 2022-01-30 | Disposition: A | Discharge status: Home / Self Care | Payer: Medicare PPO | Source: Ambulatory Visit | Attending: Internal Medicine | Admitting: Internal Medicine

## 2022-01-30 ENCOUNTER — Ambulatory Visit: Payer: Medicare PPO

## 2022-01-30 DIAGNOSIS — R922 Inconclusive mammogram: Secondary | ICD-10-CM | POA: Diagnosis not present

## 2022-01-30 DIAGNOSIS — R928 Other abnormal and inconclusive findings on diagnostic imaging of breast: Secondary | ICD-10-CM

## 2022-02-13 ENCOUNTER — Ambulatory Visit: Payer: Medicare PPO | Admitting: Obstetrics & Gynecology

## 2022-02-23 ENCOUNTER — Encounter: Payer: Self-pay | Admitting: Obstetrics & Gynecology

## 2022-02-23 ENCOUNTER — Ambulatory Visit: Payer: Medicare PPO | Admitting: Obstetrics & Gynecology

## 2022-02-23 VITALS — BP 130/78 | HR 66

## 2022-02-23 DIAGNOSIS — Z780 Asymptomatic menopausal state: Secondary | ICD-10-CM | POA: Diagnosis not present

## 2022-02-23 DIAGNOSIS — M810 Age-related osteoporosis without current pathological fracture: Secondary | ICD-10-CM

## 2022-02-23 MED ORDER — ALENDRONATE SODIUM 70 MG PO TABS: 70.0000 mg | ORAL_TABLET | ORAL | 4 refills | Status: DC

## 2022-02-23 NOTE — Progress Notes (Signed)
    Regina Gilbert November 22, 1945 [id number]        76 y.o.  G0  RP: Counseling and management of Osteoporosis  HPI: Osteoporosis at the AP Spine T-Score -2.6 on BD in 12/2021.  Significant decrease in BD at the AP Spine.  Postmenopause on no HRT.  Taking Ca++ 1200 mg/day and Vit D.  Enjoys working in the yard.  Good balance.  No h/o fragility fracture.   OB History  Gravida Para Term Preterm AB Living  0            SAB IAB Ectopic Multiple Live Births               Past medical history,surgical history, problem list, medications, allergies, family history and social history were all reviewed and documented in the EPIC chart.   Directed ROS with pertinent positives and negatives documented in the history of present illness/assessment and plan.  Exam:  Vitals:   02/23/22 0857  BP: 130/78  Pulse: 66  SpO2: 99%   General appearance:  Normal  Bone density 12/23/21:  Osteoporosis at the AP Spine T-Score -2.6 on BD in 12/2021.  Significant decrease in BD at the AP Spine.  Other sites stable in Osteopenia range.   Assessment/Plan:  76 y.o. G0P0   1. Age-related osteoporosis without current pathological fracture Osteoporosis at the AP Spine T-Score -2.6 on BD in 12/2021.  Significant decrease in BD at the AP Spine.  Postmenopause on no HRT.  Taking Ca++ 1200 mg/day and Vit D.  Enjoys working in the yard.  Good balance.  No h/o fragility fracture.  Counseling on Osteoporosis with high risk of fragility fractures.  Decision to start on Alendronate 70 mg PO weekly.  No CI.  Risks including GERD and Necrosis of the jaw discussed.  Stopping Alendronate prior, during and post until full healing of major dental work recommended.  Usage reviewed and prescription sent to pharmacy.  2. Postmenopause Postmenopause on no HRT.   Other orders - alendronate (FOSAMAX) 70 MG tablet; Take 1 tablet (70 mg total) by mouth every 7 (seven) days. Take with a full glass of water on an empty stomach.    Genia Del MD, 9:27 AM 02/23/2022

## 2022-02-24 DIAGNOSIS — I10 Essential (primary) hypertension: Secondary | ICD-10-CM | POA: Diagnosis not present

## 2022-02-24 DIAGNOSIS — M810 Age-related osteoporosis without current pathological fracture: Secondary | ICD-10-CM | POA: Diagnosis not present

## 2022-02-24 DIAGNOSIS — E785 Hyperlipidemia, unspecified: Secondary | ICD-10-CM | POA: Diagnosis not present

## 2022-02-24 DIAGNOSIS — R7989 Other specified abnormal findings of blood chemistry: Secondary | ICD-10-CM | POA: Diagnosis not present

## 2022-03-03 DIAGNOSIS — Z1339 Encounter for screening examination for other mental health and behavioral disorders: Secondary | ICD-10-CM | POA: Diagnosis not present

## 2022-03-03 DIAGNOSIS — Z0000 Encounter for general adult medical examination without abnormal findings: Secondary | ICD-10-CM | POA: Diagnosis not present

## 2022-03-03 DIAGNOSIS — I10 Essential (primary) hypertension: Secondary | ICD-10-CM | POA: Diagnosis not present

## 2022-03-03 DIAGNOSIS — Z1331 Encounter for screening for depression: Secondary | ICD-10-CM | POA: Diagnosis not present

## 2022-03-03 DIAGNOSIS — R82998 Other abnormal findings in urine: Secondary | ICD-10-CM | POA: Diagnosis not present

## 2022-03-03 DIAGNOSIS — J301 Allergic rhinitis due to pollen: Secondary | ICD-10-CM | POA: Diagnosis not present

## 2022-03-03 DIAGNOSIS — M1990 Unspecified osteoarthritis, unspecified site: Secondary | ICD-10-CM | POA: Diagnosis not present

## 2022-03-03 DIAGNOSIS — M810 Age-related osteoporosis without current pathological fracture: Secondary | ICD-10-CM | POA: Diagnosis not present

## 2022-03-03 DIAGNOSIS — E785 Hyperlipidemia, unspecified: Secondary | ICD-10-CM | POA: Diagnosis not present

## 2022-03-26 ENCOUNTER — Ambulatory Visit: Payer: Medicare PPO | Admitting: Podiatry

## 2022-03-26 ENCOUNTER — Encounter: Payer: Self-pay | Admitting: Podiatry

## 2022-03-26 DIAGNOSIS — Q828 Other specified congenital malformations of skin: Secondary | ICD-10-CM | POA: Diagnosis not present

## 2022-03-26 NOTE — Telephone Encounter (Signed)
Last read by Radene Journey at 12:47 PM on 03/22/2022.

## 2022-03-29 NOTE — Progress Notes (Signed)
Subjective:   Patient ID: Regina Gilbert, female   DOB: 76 y.o.   MRN: [id number]   HPI Patient presents with painful lesion on the bottom of the left foot   ROS      Objective:  Physical Exam  Neurovascular status intact muscle strength found to be adequate painful callus plantar aspect left at the metatarsal juncture     Assessment:  Chronic lesion plantar aspect left with pain     Plan:  Sterile prep debridement accomplished no angiogenic bleeding reappoint routine care

## 2022-07-06 DIAGNOSIS — Z1211 Encounter for screening for malignant neoplasm of colon: Secondary | ICD-10-CM | POA: Diagnosis not present

## 2022-07-31 ENCOUNTER — Encounter: Payer: Self-pay | Admitting: Podiatry

## 2022-07-31 ENCOUNTER — Ambulatory Visit: Payer: Medicare PPO | Admitting: Podiatry

## 2022-07-31 DIAGNOSIS — Q828 Other specified congenital malformations of skin: Secondary | ICD-10-CM | POA: Diagnosis not present

## 2022-07-31 DIAGNOSIS — J301 Allergic rhinitis due to pollen: Secondary | ICD-10-CM | POA: Insufficient documentation

## 2022-07-31 DIAGNOSIS — M7752 Other enthesopathy of left foot: Secondary | ICD-10-CM

## 2022-07-31 DIAGNOSIS — M1990 Unspecified osteoarthritis, unspecified site: Secondary | ICD-10-CM | POA: Insufficient documentation

## 2022-07-31 NOTE — Progress Notes (Signed)
Subjective:   Patient ID: Radene Journey, female   DOB: 77 y.o.   MRN: [id number]   HPI Patient presents stating she is got inflammation of her left foot she wanted checked as she has been on it a lot as her husband passed away and also has 2 chronic lesions that become tender and hard for her to walk on   ROS      Objective:  Physical Exam  Neurovascular status intact with inflammation around the second and third MPJs left mild to moderate in intensity and 2 painful lucent keratotic lesions plantar aspect left foot around the proximal metatarsal head third left and distal with inflammation noted     Assessment:  Mild inflammatory capsulitis probably due to increased walking along with chronic lesion painful x 2 porokeratotic     Plan:  H&P reviewed capsulitis and can take oral anti-inflammatories as needed and wear rigid bottom shoes.  Today that was my main discussion and I did do courtesy debridement of 2 lesions left but those will need to be done on a routine basis and based on her past history I am estimating around every 3 to 4 months.  She is scheduled for these to be taken out again as needed in approximate 3 to 4 months

## 2022-10-22 ENCOUNTER — Ambulatory Visit: Payer: Medicare PPO | Admitting: Podiatry

## 2022-10-22 ENCOUNTER — Encounter: Payer: Self-pay | Admitting: Podiatry

## 2022-10-22 DIAGNOSIS — D2372 Other benign neoplasm of skin of left lower limb, including hip: Secondary | ICD-10-CM

## 2022-10-22 DIAGNOSIS — D367 Benign neoplasm of other specified sites: Secondary | ICD-10-CM

## 2022-10-22 NOTE — Progress Notes (Signed)
Subjective:   Patient ID: Regina Gilbert, female   DOB: 77 y.o.   MRN: [id number]   HPI Patient presents with painful callus underneath the left foot with lucent core and pain    ROS      Objective:  Physical Exam  Neurovascular status intact lesion formation plantar aspect left that upon debridement shows pinpoint bleeding is painful to lateral pressure     Assessment:  Verruca plantaris plantar left     Plan:  Sharp sterile debridement of lesion applied chemical agent to create immune response with sterile dressing and reappoint explaining what to do if any blistering were to occur

## 2023-01-01 ENCOUNTER — Other Ambulatory Visit: Payer: Self-pay | Admitting: Internal Medicine

## 2023-01-01 DIAGNOSIS — Z1231 Encounter for screening mammogram for malignant neoplasm of breast: Secondary | ICD-10-CM

## 2023-01-14 ENCOUNTER — Encounter: Payer: Self-pay | Admitting: Podiatry

## 2023-01-14 ENCOUNTER — Ambulatory Visit: Payer: Medicare PPO | Admitting: Podiatry

## 2023-01-14 ENCOUNTER — Ambulatory Visit: Payer: Medicare PPO

## 2023-01-14 DIAGNOSIS — D367 Benign neoplasm of other specified sites: Secondary | ICD-10-CM

## 2023-01-14 DIAGNOSIS — M76821 Posterior tibial tendinitis, right leg: Secondary | ICD-10-CM

## 2023-01-14 DIAGNOSIS — D2372 Other benign neoplasm of skin of left lower limb, including hip: Secondary | ICD-10-CM | POA: Diagnosis not present

## 2023-01-14 MED ORDER — TRIAMCINOLONE ACETONIDE 10 MG/ML IJ SUSP
10.0000 mg | Freq: Once | INTRAMUSCULAR | Status: AC
  Administered 2023-01-14: 10 mg via INTRA_ARTICULAR

## 2023-01-15 NOTE — Progress Notes (Signed)
Subjective:   Patient ID: Regina Gilbert, female   DOB: 77 y.o.   MRN: [id number]   HPI Patient presents with reoccurrence of lesion left that is painful when pressed making it hard to walk and complaining of a lot of pain in the right ankle which has come up recently   ROS      Objective:  Physical Exam  Neurovascular status intact with patient found to have lesion left midfoot that upon debridement shows pinpoint bleeding measuring around 8 x 8 mm and painful to lateral pressure and on the right is noted to have exquisite discomfort in the posterior tibial tendon as it comes underneath the medial malleolus with no loss of muscle function     Assessment:  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  Reappoint as symptoms indicate

## 2023-02-01 ENCOUNTER — Ambulatory Visit: Payer: Medicare PPO

## 2023-02-01 ENCOUNTER — Ambulatory Visit
Admission: RE | Admit: 2023-02-01 | Discharge: 2023-02-01 | Disposition: A | Discharge status: Home / Self Care | Payer: Medicare PPO | Source: Ambulatory Visit | Attending: Internal Medicine | Admitting: Internal Medicine

## 2023-02-01 DIAGNOSIS — Z1231 Encounter for screening mammogram for malignant neoplasm of breast: Secondary | ICD-10-CM

## 2023-02-05 DIAGNOSIS — H26493 Other secondary cataract, bilateral: Secondary | ICD-10-CM | POA: Diagnosis not present

## 2023-02-05 DIAGNOSIS — H43813 Vitreous degeneration, bilateral: Secondary | ICD-10-CM | POA: Diagnosis not present

## 2023-02-05 DIAGNOSIS — Z961 Presence of intraocular lens: Secondary | ICD-10-CM | POA: Diagnosis not present

## 2023-02-05 DIAGNOSIS — H524 Presbyopia: Secondary | ICD-10-CM | POA: Diagnosis not present

## 2023-02-05 DIAGNOSIS — H52203 Unspecified astigmatism, bilateral: Secondary | ICD-10-CM | POA: Diagnosis not present

## 2023-03-02 NOTE — Progress Notes (Signed)
treated

## 2023-03-05 DIAGNOSIS — I10 Essential (primary) hypertension: Secondary | ICD-10-CM | POA: Diagnosis not present

## 2023-03-05 DIAGNOSIS — E785 Hyperlipidemia, unspecified: Secondary | ICD-10-CM | POA: Diagnosis not present

## 2023-03-05 DIAGNOSIS — M810 Age-related osteoporosis without current pathological fracture: Secondary | ICD-10-CM | POA: Diagnosis not present

## 2023-03-12 DIAGNOSIS — Z23 Encounter for immunization: Secondary | ICD-10-CM | POA: Diagnosis not present

## 2023-03-12 DIAGNOSIS — J301 Allergic rhinitis due to pollen: Secondary | ICD-10-CM | POA: Diagnosis not present

## 2023-03-12 DIAGNOSIS — Z1331 Encounter for screening for depression: Secondary | ICD-10-CM | POA: Diagnosis not present

## 2023-03-12 DIAGNOSIS — E785 Hyperlipidemia, unspecified: Secondary | ICD-10-CM | POA: Diagnosis not present

## 2023-03-12 DIAGNOSIS — R82998 Other abnormal findings in urine: Secondary | ICD-10-CM | POA: Diagnosis not present

## 2023-03-12 DIAGNOSIS — Z0000 Encounter for general adult medical examination without abnormal findings: Secondary | ICD-10-CM | POA: Diagnosis not present

## 2023-03-12 DIAGNOSIS — R300 Dysuria: Secondary | ICD-10-CM | POA: Diagnosis not present

## 2023-03-12 DIAGNOSIS — Z1339 Encounter for screening examination for other mental health and behavioral disorders: Secondary | ICD-10-CM | POA: Diagnosis not present

## 2023-03-12 DIAGNOSIS — I10 Essential (primary) hypertension: Secondary | ICD-10-CM | POA: Diagnosis not present

## 2023-03-12 DIAGNOSIS — I499 Cardiac arrhythmia, unspecified: Secondary | ICD-10-CM | POA: Diagnosis not present

## 2023-03-14 LAB — LAB REPORT - SCANNED
Albumin, Urine POC: 11.5
Creatinine, POC: 83.7 mg/dL
EGFR: 97.7
Microalb Creat Ratio: 14

## 2023-04-23 ENCOUNTER — Encounter: Payer: Self-pay | Admitting: Podiatry

## 2023-04-23 ENCOUNTER — Ambulatory Visit: Payer: Medicare PPO | Admitting: Podiatry

## 2023-04-23 DIAGNOSIS — R600 Localized edema: Secondary | ICD-10-CM

## 2023-04-23 DIAGNOSIS — B070 Plantar wart: Secondary | ICD-10-CM | POA: Diagnosis not present

## 2023-04-25 NOTE — Progress Notes (Signed)
Subjective:   Patient ID: Regina Gilbert, female   DOB: 77 y.o.   MRN: [id number]   HPI Patient presents stating that a callus keeps coming back on the left foot and also that the ankle has been swelling and can be bothersome   ROS      Objective:  Physical Exam  Neurovascular status intact with lesion that now shows pinpoint bleeding upon debridement plantar left measuring 1.5 x 1 cm painful to lateral pressure and is noted to have moderate swelling in the ankle left +1 pitting negative Homans' sign noted     Assessment:  Chronic swelling along with lesion formation probable verruca plantaris left that did well     Plan:  H&P reviewed both conditions and at this point sterile sharp debridement of the benign neoplasm left and apply chemical agent to create immune response with sterile dressing and padding.  I then dispensed ankle compression stocking to help provide more support around the ankle and reduce the swelling and patient will be seen back as symptoms indicate

## 2023-05-01 ENCOUNTER — Other Ambulatory Visit: Payer: Self-pay | Admitting: Obstetrics & Gynecology

## 2023-05-03 NOTE — Telephone Encounter (Signed)
Med refill request: FOSAMAX Last AEX: 12/04/21 ML Next AEX: not scheduled Last MMG (if hormonal med) 02/01/23 Refill authorized: Please Advise, #12,0 RF, asked front to schedule pt for AEX

## 2023-05-21 ENCOUNTER — Encounter: Payer: Self-pay | Admitting: Obstetrics and Gynecology

## 2023-05-21 ENCOUNTER — Ambulatory Visit (INDEPENDENT_AMBULATORY_CARE_PROVIDER_SITE_OTHER): Payer: Medicare PPO | Admitting: Obstetrics and Gynecology

## 2023-05-21 VITALS — BP 122/68 | HR 68 | Wt 146.0 lb

## 2023-05-21 DIAGNOSIS — M810 Age-related osteoporosis without current pathological fracture: Secondary | ICD-10-CM | POA: Diagnosis not present

## 2023-05-21 MED ORDER — ALENDRONATE SODIUM 70 MG PO TABS: 70.0000 mg | ORAL_TABLET | ORAL | 3 refills | Status: DC

## 2023-05-21 NOTE — Progress Notes (Unsigned)
    Regina Gilbert February 14, 1946 [id number]   77 y.o. female with osteoporosis who presents for medication follow-up.  12/2021 Osteoporosis at the [facility] T-Score -2.6 on DXA.  Significant decrease in BD at the [facility].   Postmenopause on no HRT.  Taking Ca++ 1200 mg/day and Vit D.  Enjoys working in the yard.  Good balance.  No h/o fragility fracture. Tolerating medication well. See dentist regularly, next appt Jan 2025.  Exam:  Vitals:   05/21/23 1201  BP: 122/68  Pulse: 68  SpO2: 99%  Weight: 146 lb (66.2 kg)   Physical Exam Vitals reviewed.  Constitutional:      General: She is not in acute distress.    Appearance: Normal appearance.  HENT:     Head: Normocephalic and atraumatic.     Nose: Nose normal.  Eyes:     Extraocular Movements: Extraocular movements intact.     Conjunctiva/sclera: Conjunctivae normal.  Pulmonary:     Effort: Pulmonary effort is normal.  Musculoskeletal:        General: Normal range of motion.     Cervical back: Normal range of motion.  Neurological:     General: No focal deficit present.     Mental Status: She is alert.  Psychiatric:        Mood and Affect: Mood normal.        Behavior: Behavior normal.     Assessment and Plan  Age-related osteoporosis without current pathological fracture Assessment & Plan: Dx'd 2023. Started on fosamax 2023. Tolerating well. Plan to continue for at least 5 years. Discussed importance of weight bearing exercise, daily calcium 1200 mg daily and vit D, and fall risk reduction. Regular dental visit Contacting PCP for BMP and vit D levels. Plan for DXA q28yr. All questions answered.     Orders: -     Alendronate Sodium; Take 1 tablet (70 mg total) by mouth once a week. Take with a full glass of water on an empty stomach.  Dispense: 12 tablet; Refill: 3  Rosalyn Gess MD, 12:37 PM 05/21/2023

## 2023-05-21 NOTE — Assessment & Plan Note (Signed)
Dx'd 2023. Started on fosamax 2023. Tolerating well. Plan to continue for at least 5 years. Discussed importance of weight bearing exercise, daily calcium 1200 mg daily and vit D, and fall risk reduction. Regular dental visit Contacting PCP for BMP and vit D levels. Plan for DXA q72yr. All questions answered.

## 2023-07-24 ENCOUNTER — Other Ambulatory Visit: Payer: Self-pay | Admitting: Nurse Practitioner

## 2023-07-24 DIAGNOSIS — M810 Age-related osteoporosis without current pathological fracture: Secondary | ICD-10-CM

## 2023-07-26 NOTE — Telephone Encounter (Signed)
 Medication refill request: Fosamax   Last AEX:  05/21/23 Next AEX: not scheduled  Last MMG (if hormonal medication request): n/a Refill authorized: refill refused medication refilled 05/21/23  for one year

## 2023-10-08 ENCOUNTER — Encounter: Payer: Self-pay | Admitting: Podiatrist

## 2023-10-08 ENCOUNTER — Ambulatory Visit: Admitting: Podiatrist

## 2023-10-08 DIAGNOSIS — D2372 Other benign neoplasm of skin of left lower limb, including hip: Secondary | ICD-10-CM

## 2023-10-08 NOTE — Progress Notes (Signed)
 Chief Complaint  Patient presents with   Callouses    RM#2 Left foot callus trimming causing pain.     HPI: Patient is 78 y.o. female who presents today for pain due to a lesion of her plantar left foot.  She had had it trimmed  by Dr. Celia Coles and states it is helpful when he trims it.     Allergies  Allergen Reactions   Percocet [Oxycodone-Acetaminophen]     Pt.states doesn't tolerate   Cefdinir Rash    Review of systems is negative except as noted in the HPI.  Denies nausea/ vomiting/ fevers/ chills or night sweats.   Denies difficulty breathing, denies calf pain or tenderness  Physical Exam  Patient is awake, alert, and oriented x 3.  In no acute distress.    Vascular status is intact with palpable pedal pulses DP and PT bilateral and capillary refill time less than 3 seconds bilateral.  No edema or erythema noted.   Neurological exam reveals epicritic and protective sensation grossly intact bilateral.   Dermatological exam reveals skin is supple and dry to bilateral feet.  Small well-circumscribed lesion submetatarsal 3 head of the left foot.  It has ground glass appearance once debrided pain with direct pressure noted.  Musculoskeletal exam: Musculature intact with dorsiflexion, plantarflexion, inversion, eversion. prominent metatarsal heads left noted   assessment:   ICD-10-CM   1. Benign neoplasm of skin of left foot  D23.72        Plan: Discussed treatment options.  At today's visit I debrided the lesion with a #15 blade without complication.  I was able to remove the central core.  I applied Canthacur and a Band-Aid.  Recommended she keep this on for 6 hours and then wash off.  She will call if the lesion returns or if any other questions arise.

## 2023-12-02 ENCOUNTER — Encounter: Payer: Self-pay | Admitting: Podiatry

## 2023-12-02 ENCOUNTER — Ambulatory Visit: Admitting: Podiatry

## 2023-12-02 DIAGNOSIS — B070 Plantar wart: Secondary | ICD-10-CM

## 2023-12-02 NOTE — Progress Notes (Signed)
 Subjective:   Patient ID: Regina Gilbert, female   DOB: 78 y.o.   MRN: [id number]   HPI Patient presents with painful return of callus and wart plantar left stating that the last treatment performed by different physician was not adequate   ROS      Objective:  Physical Exam  Neurovascular status intact with thick keratotic lesion subleft forefoot that upon debridement shows pinpoint bleeding pain to lateral pressure     Assessment:  Verruca plantaris plantar left painful     Plan:  H&P reviewed today I did sharp sterile debridement of the lesions applied chemical agent to create immune response explained what to do if any blistering were to occur and reappoint to recheck

## 2023-12-06 NOTE — Progress Notes (Signed)
 78 y.o. G0P0 postmenopausal female with osteoporosis on Fosamax  (started 2023) here for annual exam. Married.  She reports no concerns today. Stopped fosamax  2/2 no refills at pharmacy. Enjoys working in the yard. No h/o fragility fracture.   Postmenopausal bleeding: none Pelvic discharge or pain: none Breast mass, nipple discharge or skin changes : none Sexually active: No   Last PAP: 10/14/17 NIL No h/o abnormal Pap.  No indication for a Pap at this time.   Last mammogram: 02/01/23 BI-RADS CAT 1 - Negative, density C Last DXA: 12/23/21 T-score -2.6 at spine Last colonoscopy: Colorguard 05/2019 Negative  Exercising: No Smoker:No  Flowsheet Row Office Visit [facility] 12/08/2023 [facility]  PHQ-2 Total Score 0      GYN HISTORY: No sig hx  OB History  Gravida Para Term Preterm AB Living  0       SAB IAB Ectopic Multiple Live Births        Obstetric Comments  Has an adopted child   Past Medical History:  Diagnosis Date   High cholesterol    Hypertension    Past Surgical History:  Procedure Laterality Date   CATARACT EXTRACTION Left 11/16/2018   CATARACT EXTRACTION Right 2020   Current Outpatient Medications on File Prior to Visit  Medication Sig Dispense Refill   amLODipine-valsartan (EXFORGE) 5-320 MG per tablet Take 1 tablet by mouth daily.     Calcium Citrate-Vitamin D (CALCIUM + D PO) Take by mouth.     cholecalciferol (VITAMIN D) 1000 UNITS tablet Take 1,000 Units by mouth daily.     fluticasone (FLONASE) 50 MCG/ACT nasal spray Place 1 spray into both nostrils daily.     loratadine (CLARITIN) 10 MG tablet Take 10 mg by mouth daily as needed.     rosuvastatin (CRESTOR) 10 MG tablet Take 10 mg by mouth daily.     No current facility-administered medications on file prior to visit.   Social History   Socioeconomic History   Marital status: Married    Spouse name: Not on file   Number of children: Not on file   Years of  education: Not on file   Highest education level: Not on file  Occupational History   Not on file  Tobacco Use   Smoking status: Never   Smokeless tobacco: Never  Vaping Use   Vaping status: Never Used  Substance and Sexual Activity   Alcohol  use: No    Alcohol /week: 0.0 standard drinks of alcohol    Drug use: No   Sexual activity: Not Currently    Partners: Male    Birth control/protection: Post-menopausal    Comment: 1st intercourse- 18, partners- 2  Other Topics Concern   Not on file  Social History Narrative   Not on file   Social Drivers of Health   Financial Resource Strain: Not on file  Food Insecurity: Not on file  Transportation Needs: Not on file  Physical Activity: Not on file  Stress: Not on file  Social Connections: Not on file  Intimate Partner Violence: Not on file   Family History  Problem Relation Age of Onset   Hypertension Father    Hypertension Sister    Hypertension Sister    Stroke Brother    Hypertension Brother    Allergies  Allergen Reactions   Percocet [Oxycodone-Acetaminophen]     Pt.states doesn't tolerate   Cefdinir Rash      PE Today's Vitals   12/08/23 1121  BP: (!) 118/58  Pulse: 70  Temp: 97.9 F (36.6 C)  TempSrc: Oral  SpO2: 98%  Weight: 142 lb (64.4 kg)  Height: 5' 2.25 (1.581 m)   Body mass index is 25.76 kg/m.  Physical Exam Vitals reviewed. Exam conducted with a chaperone present.  Constitutional:      General: She is not in acute distress.    Appearance: Normal appearance.  HENT:     Head: Normocephalic and atraumatic.     Nose: Nose normal.   Eyes:     Extraocular Movements: Extraocular movements intact.     Conjunctiva/sclera: Conjunctivae normal.   Neck:     Thyroid: No thyroid mass, thyromegaly or thyroid tenderness.  Pulmonary:     Effort: Pulmonary effort is normal.  Chest:     Chest wall: No mass or tenderness.  Breasts:    Right: Normal. No swelling, mass, nipple discharge, skin change  or tenderness.     Left: Normal. No swelling, mass, nipple discharge, skin change or tenderness.  Abdominal:     General: There is no distension.     Palpations: Abdomen is soft.     Tenderness: There is no abdominal tenderness.  Genitourinary:    General: Normal vulva.     Exam position: Lithotomy position.     Urethra: No prolapse.     Vagina: Normal. No vaginal discharge or bleeding.     Cervix: Normal. No lesion.     Uterus: Normal. Not enlarged and not tender.      Adnexa: Right adnexa normal and left adnexa normal.   Musculoskeletal:        General: Normal range of motion.     Cervical back: Normal range of motion.  Lymphadenopathy:     Upper Body:     Right upper body: No axillary adenopathy.     Left upper body: No axillary adenopathy.     Lower Body: No right inguinal adenopathy. No left inguinal adenopathy.   Skin:    General: Skin is warm and dry.   Neurological:     General: No focal deficit present.     Mental Status: She is alert.   Psychiatric:        Mood and Affect: Mood normal.        Behavior: Behavior normal.      Assessment and Plan:        Encounter for breast and pelvic examination Assessment & Plan: Cervical cancer screening performed according to ASCCP guidelines. Encouraged annual mammogram screening Cologuard completed 2020, f/u PCP DXA 2023, due, ordered Labs and immunizations with her primary Encouraged safe sexual practices as indicated Encouraged healthy lifestyle practices with diet and exercise For patients over 70yo, I recommend 1200mg  calcium daily and 800IU of vitamin D daily.    Age-related osteoporosis without current pathological fracture Assessment & Plan: Dx'd 2023. Started on fosamax  2023. Tolerating well. Plan to continue for at least 5 years. Continue daily calcium 1200 mg daily and vit D and regular exercise. Contacting PCP for BMP and vit D levels. Plan for DXA this year. All questions answered.     Orders: -      DG Bone Density; Future -     Alendronate  Sodium; Take 1 tablet (70 mg total) by mouth once a week. Take with a full glass of water on an empty stomach.  Dispense: 12 tablet; Refill: 3   Vera LULLA Pa, MD

## 2023-12-08 ENCOUNTER — Encounter: Payer: Self-pay | Admitting: Obstetrics and Gynecology

## 2023-12-08 ENCOUNTER — Ambulatory Visit (INDEPENDENT_AMBULATORY_CARE_PROVIDER_SITE_OTHER): Admitting: Obstetrics and Gynecology

## 2023-12-08 VITALS — BP 118/58 | HR 70 | Temp 97.9°F | Ht 62.25 in | Wt 142.0 lb

## 2023-12-08 DIAGNOSIS — Z01419 Encounter for gynecological examination (general) (routine) without abnormal findings: Secondary | ICD-10-CM | POA: Diagnosis not present

## 2023-12-08 DIAGNOSIS — M810 Age-related osteoporosis without current pathological fracture: Secondary | ICD-10-CM

## 2023-12-08 DIAGNOSIS — Z1331 Encounter for screening for depression: Secondary | ICD-10-CM

## 2023-12-08 MED ORDER — ALENDRONATE SODIUM 70 MG PO TABS
70.0000 mg | ORAL_TABLET | ORAL | 3 refills | Status: AC
Start: 2023-12-08 — End: ?

## 2023-12-08 NOTE — Patient Instructions (Signed)

## 2023-12-08 NOTE — Assessment & Plan Note (Signed)
 Dx'd 2023. Started on fosamax  2023. Tolerating well. Plan to continue for at least 5 years. Continue daily calcium 1200 mg daily and vit D and regular exercise. Contacting PCP for BMP and vit D levels. Plan for DXA this year. All questions answered.

## 2023-12-08 NOTE — Assessment & Plan Note (Signed)
 Cervical cancer screening performed according to ASCCP guidelines. Encouraged annual mammogram screening Cologuard completed 2020, f/u PCP DXA 2023, due, ordered Labs and immunizations with her primary Encouraged safe sexual practices as indicated Encouraged healthy lifestyle practices with diet and exercise For patients over 80yo, I recommend 1200mg  calcium daily and 800IU of vitamin D daily.

## 2023-12-10 ENCOUNTER — Ambulatory Visit: Payer: Self-pay | Admitting: Obstetrics and Gynecology

## 2024-01-19 ENCOUNTER — Other Ambulatory Visit: Payer: Self-pay | Admitting: Internal Medicine

## 2024-01-19 DIAGNOSIS — Z1231 Encounter for screening mammogram for malignant neoplasm of breast: Secondary | ICD-10-CM

## 2024-02-03 DIAGNOSIS — Z961 Presence of intraocular lens: Secondary | ICD-10-CM | POA: Diagnosis not present

## 2024-02-03 DIAGNOSIS — H26493 Other secondary cataract, bilateral: Secondary | ICD-10-CM | POA: Diagnosis not present

## 2024-02-03 DIAGNOSIS — H43813 Vitreous degeneration, bilateral: Secondary | ICD-10-CM | POA: Diagnosis not present

## 2024-02-04 ENCOUNTER — Ambulatory Visit
Admission: RE | Admit: 2024-02-04 | Discharge: 2024-02-04 | Disposition: A | Discharge status: Home / Self Care | Source: Ambulatory Visit | Attending: Internal Medicine | Admitting: Internal Medicine

## 2024-02-04 DIAGNOSIS — Z1231 Encounter for screening mammogram for malignant neoplasm of breast: Secondary | ICD-10-CM | POA: Diagnosis not present

## 2024-02-09 ENCOUNTER — Other Ambulatory Visit: Payer: Self-pay | Admitting: Internal Medicine

## 2024-02-09 DIAGNOSIS — R928 Other abnormal and inconclusive findings on diagnostic imaging of breast: Secondary | ICD-10-CM

## 2024-02-10 ENCOUNTER — Ambulatory Visit: Payer: Self-pay | Admitting: Obstetrics and Gynecology

## 2024-02-17 ENCOUNTER — Ambulatory Visit
Admission: RE | Admit: 2024-02-17 | Discharge: 2024-02-17 | Disposition: A | Discharge status: Home / Self Care | Source: Ambulatory Visit | Attending: Internal Medicine | Admitting: Internal Medicine

## 2024-02-17 ENCOUNTER — Ambulatory Visit: Payer: Self-pay | Admitting: Obstetrics and Gynecology

## 2024-02-17 DIAGNOSIS — R928 Other abnormal and inconclusive findings on diagnostic imaging of breast: Secondary | ICD-10-CM | POA: Diagnosis not present

## 2024-02-17 DIAGNOSIS — N6489 Other specified disorders of breast: Secondary | ICD-10-CM | POA: Diagnosis not present

## 2024-02-25 DIAGNOSIS — E785 Hyperlipidemia, unspecified: Secondary | ICD-10-CM | POA: Diagnosis not present

## 2024-02-25 DIAGNOSIS — M8580 Other specified disorders of bone density and structure, unspecified site: Secondary | ICD-10-CM | POA: Diagnosis not present

## 2024-02-25 DIAGNOSIS — Z0189 Encounter for other specified special examinations: Secondary | ICD-10-CM | POA: Diagnosis not present

## 2024-03-09 ENCOUNTER — Encounter: Payer: Self-pay | Admitting: Podiatry

## 2024-03-09 ENCOUNTER — Ambulatory Visit (INDEPENDENT_AMBULATORY_CARE_PROVIDER_SITE_OTHER)

## 2024-03-09 ENCOUNTER — Ambulatory Visit: Admitting: Podiatry

## 2024-03-09 VITALS — Ht 62.5 in | Wt 142.0 lb

## 2024-03-09 DIAGNOSIS — M76821 Posterior tibial tendinitis, right leg: Secondary | ICD-10-CM | POA: Diagnosis not present

## 2024-03-09 DIAGNOSIS — M7752 Other enthesopathy of left foot: Secondary | ICD-10-CM | POA: Diagnosis not present

## 2024-03-09 DIAGNOSIS — M7751 Other enthesopathy of right foot: Secondary | ICD-10-CM

## 2024-03-09 DIAGNOSIS — B070 Plantar wart: Secondary | ICD-10-CM

## 2024-03-09 MED ORDER — TRIAMCINOLONE ACETONIDE 10 MG/ML IJ SUSP
10.0000 mg | Freq: Once | INTRAMUSCULAR | Status: AC
  Administered 2024-03-09: 10 mg via INTRA_ARTICULAR

## 2024-03-09 NOTE — Progress Notes (Signed)
 Subjective:   Patient ID: Regina Gilbert, female   DOB: 78 y.o.   MRN: [id number]   HPI Patient presents with severe lesion plantar aspect left that at this time is not responding conservatively giving her problems with walking and only getting relief for short.     ROS      Objective:  Physical Exam  Neurovascular status found to be intact good digital perfusion noted with lesion that is slightly proximal to the third metatarsal head but localized to this area with discomfort also around the fifth metatarsal with offloading of gait     Assessment:  Still most likely benign neoplasm verruca but given its lack of response also cannot rule out bony pathology     Plan:  H&P x-ray reviewed and discussed at 1 point the possibility for shortening osteotomy third metatarsal and today I did sharp sterile debridement of the lesion and applied chemical agent to treat immune response.  I then applied sterile dressing I instructed on what might be necessary surgically if it does not resolve and reappoint 6 weeks to reevaluate and explain what to do if blistering were to occur  X-ray with marker indicates that the lesion is slightly proximal to the third metatarsal left

## 2024-03-10 DIAGNOSIS — I10 Essential (primary) hypertension: Secondary | ICD-10-CM | POA: Diagnosis not present

## 2024-03-10 DIAGNOSIS — E785 Hyperlipidemia, unspecified: Secondary | ICD-10-CM | POA: Diagnosis not present

## 2024-03-17 DIAGNOSIS — I499 Cardiac arrhythmia, unspecified: Secondary | ICD-10-CM | POA: Diagnosis not present

## 2024-03-17 DIAGNOSIS — R82998 Other abnormal findings in urine: Secondary | ICD-10-CM | POA: Diagnosis not present

## 2024-03-17 DIAGNOSIS — E785 Hyperlipidemia, unspecified: Secondary | ICD-10-CM | POA: Diagnosis not present

## 2024-03-17 DIAGNOSIS — Z1331 Encounter for screening for depression: Secondary | ICD-10-CM | POA: Diagnosis not present

## 2024-03-17 DIAGNOSIS — I10 Essential (primary) hypertension: Secondary | ICD-10-CM | POA: Diagnosis not present

## 2024-03-17 DIAGNOSIS — Z0000 Encounter for general adult medical examination without abnormal findings: Secondary | ICD-10-CM | POA: Diagnosis not present

## 2024-03-17 DIAGNOSIS — J069 Acute upper respiratory infection, unspecified: Secondary | ICD-10-CM | POA: Diagnosis not present

## 2024-03-17 DIAGNOSIS — J301 Allergic rhinitis due to pollen: Secondary | ICD-10-CM | POA: Diagnosis not present

## 2024-03-17 DIAGNOSIS — M810 Age-related osteoporosis without current pathological fracture: Secondary | ICD-10-CM | POA: Diagnosis not present

## 2024-03-17 DIAGNOSIS — Z1339 Encounter for screening examination for other mental health and behavioral disorders: Secondary | ICD-10-CM | POA: Diagnosis not present

## 2024-05-04 ENCOUNTER — Encounter: Payer: Self-pay | Admitting: Podiatry

## 2024-05-04 ENCOUNTER — Ambulatory Visit: Admitting: Podiatry

## 2024-05-04 DIAGNOSIS — Q828 Other specified congenital malformations of skin: Secondary | ICD-10-CM

## 2024-05-04 DIAGNOSIS — B070 Plantar wart: Secondary | ICD-10-CM | POA: Diagnosis not present

## 2024-05-04 DIAGNOSIS — M7752 Other enthesopathy of left foot: Secondary | ICD-10-CM

## 2024-05-05 NOTE — Progress Notes (Signed)
 Subjective:   Patient ID: Regina Gilbert, female   DOB: 78 y.o.   MRN: [id number]   HPI Patient states she is improved but still having some pain with fluid buildup in the midfoot left    ROS      Objective:  Physical Exam  Neurovascular status intact with patient found to have several lesions plantar left that are still tender with fluid buildup of a mild nature around the third MPJ but improved from previous     Assessment:  Several problems with inflammatory capsulitis possible verruca plantaris or porokeratosis that is under reasonably good condition now     Plan:  H&P reviewed all conditions discussed and for the capsulitis we will continue anti-inflammatories as needed stretching exercises rigid bottom shoes courtesy debridement accomplished today and patient will be seen back as symptoms indicate and may require more aggressive treatment in the future
